# Patient Record
Sex: Female | Born: 1935 | Race: White | Hispanic: No | Marital: Married | Smoking: Never smoker
Health system: Southern US, Community
[De-identification: ages and names within clinical notes are randomized; demographics above are authoritative.]

## PROBLEM LIST (undated history)

## (undated) DIAGNOSIS — E119 Type 2 diabetes mellitus without complications: Secondary | ICD-10-CM

## (undated) DIAGNOSIS — B029 Zoster without complications: Secondary | ICD-10-CM

## (undated) DIAGNOSIS — N186 End stage renal disease: Secondary | ICD-10-CM

## (undated) HISTORY — PX: CHOLECYSTECTOMY: SHX55

## (undated) HISTORY — PX: KIDNEY TRANSPLANT: SHX239

## (undated) HISTORY — PX: ABDOMINAL HYSTERECTOMY: SHX81

## (undated) HISTORY — PX: DIALYSIS FISTULA CREATION: SHX611

## (undated) HISTORY — PX: NEPHRECTOMY TRANSPLANTED ORGAN: SUR880

## (undated) HISTORY — PX: OTHER SURGICAL HISTORY: SHX169

---

## 1998-10-17 ENCOUNTER — Encounter: Admission: RE | Admit: 1998-10-17 | Discharge: 1998-10-17 | Payer: Self-pay | Admitting: Nephrology

## 1999-06-24 ENCOUNTER — Encounter (HOSPITAL_COMMUNITY): Admission: RE | Admit: 1999-06-24 | Discharge: 1999-09-22 | Payer: Self-pay | Admitting: Nephrology

## 1999-11-05 ENCOUNTER — Encounter (HOSPITAL_COMMUNITY): Admission: RE | Admit: 1999-11-05 | Discharge: 2000-02-03 | Payer: Self-pay | Admitting: Nephrology

## 2000-02-27 ENCOUNTER — Encounter: Payer: Self-pay | Admitting: Vascular Surgery

## 2000-03-02 ENCOUNTER — Ambulatory Visit (HOSPITAL_COMMUNITY): Admission: RE | Admit: 2000-03-02 | Discharge: 2000-03-02 | Payer: Self-pay | Admitting: Vascular Surgery

## 2000-04-09 ENCOUNTER — Inpatient Hospital Stay (HOSPITAL_COMMUNITY): Admission: AD | Admit: 2000-04-09 | Discharge: 2000-04-11 | Payer: Self-pay | Admitting: Nephrology

## 2000-04-10 ENCOUNTER — Encounter: Payer: Self-pay | Admitting: Nephrology

## 2000-04-12 ENCOUNTER — Emergency Department (HOSPITAL_COMMUNITY): Admission: EM | Admit: 2000-04-12 | Discharge: 2000-04-12 | Payer: Self-pay | Admitting: Emergency Medicine

## 2000-11-30 ENCOUNTER — Ambulatory Visit (HOSPITAL_COMMUNITY): Admission: RE | Admit: 2000-11-30 | Discharge: 2000-11-30 | Payer: Self-pay | Admitting: Vascular Surgery

## 2001-01-15 ENCOUNTER — Other Ambulatory Visit: Admission: RE | Admit: 2001-01-15 | Discharge: 2001-01-15 | Payer: Self-pay | Admitting: Obstetrics & Gynecology

## 2001-01-28 ENCOUNTER — Ambulatory Visit (HOSPITAL_COMMUNITY): Admission: RE | Admit: 2001-01-28 | Discharge: 2001-01-28 | Payer: Self-pay | Admitting: *Deleted

## 2001-02-04 ENCOUNTER — Ambulatory Visit (HOSPITAL_COMMUNITY): Admission: RE | Admit: 2001-02-04 | Discharge: 2001-02-04 | Payer: Self-pay | Admitting: Vascular Surgery

## 2001-02-04 ENCOUNTER — Encounter: Payer: Self-pay | Admitting: Vascular Surgery

## 2001-03-19 ENCOUNTER — Encounter: Payer: Self-pay | Admitting: Vascular Surgery

## 2001-03-19 ENCOUNTER — Ambulatory Visit (HOSPITAL_COMMUNITY): Admission: RE | Admit: 2001-03-19 | Discharge: 2001-03-19 | Payer: Self-pay | Admitting: Vascular Surgery

## 2001-04-08 ENCOUNTER — Ambulatory Visit (HOSPITAL_COMMUNITY): Admission: RE | Admit: 2001-04-08 | Discharge: 2001-04-08 | Payer: Self-pay | Admitting: Vascular Surgery

## 2001-04-27 ENCOUNTER — Ambulatory Visit (HOSPITAL_COMMUNITY): Admission: RE | Admit: 2001-04-27 | Discharge: 2001-04-27 | Payer: Self-pay | Admitting: Vascular Surgery

## 2001-05-13 ENCOUNTER — Ambulatory Visit (HOSPITAL_BASED_OUTPATIENT_CLINIC_OR_DEPARTMENT_OTHER): Admission: RE | Admit: 2001-05-13 | Discharge: 2001-05-13 | Payer: Self-pay | Admitting: *Deleted

## 2001-09-30 ENCOUNTER — Encounter: Payer: Self-pay | Admitting: Nephrology

## 2001-09-30 ENCOUNTER — Ambulatory Visit (HOSPITAL_COMMUNITY): Admission: RE | Admit: 2001-09-30 | Discharge: 2001-09-30 | Payer: Self-pay | Admitting: Nephrology

## 2001-11-16 ENCOUNTER — Encounter: Payer: Self-pay | Admitting: Family Medicine

## 2001-11-16 ENCOUNTER — Ambulatory Visit (HOSPITAL_COMMUNITY): Admission: RE | Admit: 2001-11-16 | Discharge: 2001-11-16 | Payer: Self-pay | Admitting: Family Medicine

## 2002-03-21 ENCOUNTER — Ambulatory Visit (HOSPITAL_COMMUNITY): Admission: RE | Admit: 2002-03-21 | Discharge: 2002-03-21 | Payer: Self-pay | Admitting: Family Medicine

## 2002-03-21 ENCOUNTER — Encounter: Payer: Self-pay | Admitting: Family Medicine

## 2002-04-07 ENCOUNTER — Encounter (HOSPITAL_COMMUNITY): Admission: RE | Admit: 2002-04-07 | Discharge: 2002-05-07 | Payer: Self-pay | Admitting: Internal Medicine

## 2002-04-14 ENCOUNTER — Encounter: Payer: Self-pay | Admitting: Neurology

## 2002-10-24 ENCOUNTER — Ambulatory Visit (HOSPITAL_COMMUNITY): Admission: RE | Admit: 2002-10-24 | Discharge: 2002-10-24 | Payer: Self-pay | Admitting: General Surgery

## 2002-12-13 ENCOUNTER — Ambulatory Visit (HOSPITAL_COMMUNITY): Admission: RE | Admit: 2002-12-13 | Discharge: 2002-12-13 | Payer: Self-pay | Admitting: Family Medicine

## 2002-12-14 ENCOUNTER — Ambulatory Visit (HOSPITAL_COMMUNITY): Admission: RE | Admit: 2002-12-14 | Discharge: 2002-12-14 | Payer: Self-pay | Admitting: Family Medicine

## 2003-02-18 ENCOUNTER — Encounter: Admission: RE | Admit: 2003-02-18 | Discharge: 2003-02-18 | Payer: Self-pay | Admitting: Neurology

## 2003-02-23 ENCOUNTER — Encounter: Admission: RE | Admit: 2003-02-23 | Discharge: 2003-02-23 | Payer: Self-pay | Admitting: Neurology

## 2003-02-28 ENCOUNTER — Encounter: Admission: RE | Admit: 2003-02-28 | Discharge: 2003-02-28 | Payer: Self-pay | Admitting: Neurology

## 2003-04-07 ENCOUNTER — Encounter: Admission: RE | Admit: 2003-04-07 | Discharge: 2003-04-07 | Payer: Self-pay | Admitting: Neurology

## 2003-05-04 ENCOUNTER — Encounter: Admission: RE | Admit: 2003-05-04 | Discharge: 2003-05-04 | Payer: Self-pay | Admitting: Neurology

## 2004-03-25 ENCOUNTER — Ambulatory Visit (HOSPITAL_COMMUNITY): Admission: RE | Admit: 2004-03-25 | Discharge: 2004-03-25 | Payer: Self-pay | Admitting: Ophthalmology

## 2004-09-06 ENCOUNTER — Ambulatory Visit (HOSPITAL_COMMUNITY): Admission: RE | Admit: 2004-09-06 | Discharge: 2004-09-06 | Payer: Self-pay | Admitting: Family Medicine

## 2005-01-13 ENCOUNTER — Ambulatory Visit (HOSPITAL_COMMUNITY): Admission: RE | Admit: 2005-01-13 | Discharge: 2005-01-13 | Payer: Self-pay | Admitting: Family Medicine

## 2005-01-13 ENCOUNTER — Ambulatory Visit (HOSPITAL_COMMUNITY): Admission: RE | Admit: 2005-01-13 | Discharge: 2005-01-13 | Payer: Self-pay | Admitting: Ophthalmology

## 2005-07-23 ENCOUNTER — Ambulatory Visit (HOSPITAL_COMMUNITY): Admission: RE | Admit: 2005-07-23 | Discharge: 2005-07-23 | Payer: Self-pay | Admitting: Family Medicine

## 2005-08-03 ENCOUNTER — Emergency Department (HOSPITAL_COMMUNITY): Admission: EM | Admit: 2005-08-03 | Discharge: 2005-08-03 | Payer: Self-pay | Admitting: Emergency Medicine

## 2005-08-04 ENCOUNTER — Ambulatory Visit (HOSPITAL_COMMUNITY): Admission: RE | Admit: 2005-08-04 | Discharge: 2005-08-04 | Payer: Self-pay | Admitting: Emergency Medicine

## 2005-09-05 ENCOUNTER — Emergency Department (HOSPITAL_COMMUNITY): Admission: EM | Admit: 2005-09-05 | Discharge: 2005-09-05 | Payer: Self-pay | Admitting: Emergency Medicine

## 2005-09-07 ENCOUNTER — Emergency Department (HOSPITAL_COMMUNITY): Admission: EM | Admit: 2005-09-07 | Discharge: 2005-09-07 | Payer: Self-pay | Admitting: Emergency Medicine

## 2005-09-22 ENCOUNTER — Ambulatory Visit (HOSPITAL_COMMUNITY): Admission: RE | Admit: 2005-09-22 | Discharge: 2005-09-22 | Payer: Self-pay | Admitting: Family Medicine

## 2005-10-02 ENCOUNTER — Ambulatory Visit (HOSPITAL_COMMUNITY): Admission: RE | Admit: 2005-10-02 | Discharge: 2005-10-02 | Payer: Self-pay | Admitting: Family Medicine

## 2005-10-07 ENCOUNTER — Encounter (INDEPENDENT_AMBULATORY_CARE_PROVIDER_SITE_OTHER): Payer: Self-pay | Admitting: Specialist

## 2005-10-07 ENCOUNTER — Ambulatory Visit (HOSPITAL_COMMUNITY): Admission: RE | Admit: 2005-10-07 | Discharge: 2005-10-07 | Payer: Self-pay | Admitting: Gastroenterology

## 2005-10-07 ENCOUNTER — Ambulatory Visit: Payer: Self-pay | Admitting: Gastroenterology

## 2006-11-28 ENCOUNTER — Emergency Department (HOSPITAL_COMMUNITY): Admission: EM | Admit: 2006-11-28 | Discharge: 2006-11-28 | Payer: Self-pay | Admitting: Emergency Medicine

## 2007-02-22 ENCOUNTER — Ambulatory Visit (HOSPITAL_COMMUNITY): Admission: RE | Admit: 2007-02-22 | Discharge: 2007-02-22 | Payer: Self-pay | Admitting: Ophthalmology

## 2007-02-22 ENCOUNTER — Encounter (INDEPENDENT_AMBULATORY_CARE_PROVIDER_SITE_OTHER): Payer: Self-pay | Admitting: Ophthalmology

## 2007-05-06 ENCOUNTER — Ambulatory Visit (HOSPITAL_COMMUNITY): Admission: RE | Admit: 2007-05-06 | Discharge: 2007-05-06 | Payer: Self-pay | Admitting: Family Medicine

## 2007-06-07 ENCOUNTER — Ambulatory Visit (HOSPITAL_COMMUNITY): Admission: RE | Admit: 2007-06-07 | Discharge: 2007-06-07 | Payer: Self-pay | Admitting: Ophthalmology

## 2007-07-19 ENCOUNTER — Encounter: Payer: Self-pay | Admitting: Orthopedic Surgery

## 2007-07-19 ENCOUNTER — Ambulatory Visit (HOSPITAL_COMMUNITY): Admission: RE | Admit: 2007-07-19 | Discharge: 2007-07-19 | Payer: Self-pay | Admitting: Family Medicine

## 2007-08-18 ENCOUNTER — Ambulatory Visit: Payer: Self-pay | Admitting: Gastroenterology

## 2007-08-30 ENCOUNTER — Ambulatory Visit (HOSPITAL_COMMUNITY): Admission: RE | Admit: 2007-08-30 | Discharge: 2007-08-30 | Payer: Self-pay | Admitting: Gastroenterology

## 2007-08-30 ENCOUNTER — Ambulatory Visit: Payer: Self-pay | Admitting: Gastroenterology

## 2009-01-23 ENCOUNTER — Ambulatory Visit (HOSPITAL_COMMUNITY): Admission: RE | Admit: 2009-01-23 | Discharge: 2009-01-23 | Payer: Self-pay | Admitting: Nephrology

## 2009-01-31 ENCOUNTER — Ambulatory Visit (HOSPITAL_COMMUNITY): Admission: RE | Admit: 2009-01-31 | Discharge: 2009-01-31 | Payer: Self-pay | Admitting: Nephrology

## 2009-02-01 ENCOUNTER — Ambulatory Visit (HOSPITAL_COMMUNITY): Admission: RE | Admit: 2009-02-01 | Discharge: 2009-02-01 | Payer: Self-pay | Admitting: Family Medicine

## 2009-02-12 DIAGNOSIS — I10 Essential (primary) hypertension: Secondary | ICD-10-CM | POA: Insufficient documentation

## 2009-02-12 DIAGNOSIS — Z8679 Personal history of other diseases of the circulatory system: Secondary | ICD-10-CM | POA: Insufficient documentation

## 2009-02-12 DIAGNOSIS — N19 Unspecified kidney failure: Secondary | ICD-10-CM | POA: Insufficient documentation

## 2009-02-12 DIAGNOSIS — Z8719 Personal history of other diseases of the digestive system: Secondary | ICD-10-CM

## 2009-02-12 DIAGNOSIS — Z85828 Personal history of other malignant neoplasm of skin: Secondary | ICD-10-CM

## 2009-02-12 DIAGNOSIS — Z8611 Personal history of tuberculosis: Secondary | ICD-10-CM

## 2009-02-13 ENCOUNTER — Ambulatory Visit: Payer: Self-pay | Admitting: Gastroenterology

## 2009-02-13 DIAGNOSIS — D126 Benign neoplasm of colon, unspecified: Secondary | ICD-10-CM

## 2009-02-16 DIAGNOSIS — K3184 Gastroparesis: Secondary | ICD-10-CM

## 2009-02-16 DIAGNOSIS — K5900 Constipation, unspecified: Secondary | ICD-10-CM | POA: Insufficient documentation

## 2009-02-16 DIAGNOSIS — K219 Gastro-esophageal reflux disease without esophagitis: Secondary | ICD-10-CM | POA: Insufficient documentation

## 2009-03-12 ENCOUNTER — Ambulatory Visit: Payer: Self-pay | Admitting: Orthopedic Surgery

## 2009-03-12 DIAGNOSIS — M6530 Trigger finger, unspecified finger: Secondary | ICD-10-CM | POA: Insufficient documentation

## 2009-06-11 ENCOUNTER — Encounter: Payer: Self-pay | Admitting: Orthopedic Surgery

## 2009-06-15 ENCOUNTER — Encounter: Payer: Self-pay | Admitting: Orthopedic Surgery

## 2009-06-15 ENCOUNTER — Emergency Department (HOSPITAL_COMMUNITY): Admission: EM | Admit: 2009-06-15 | Discharge: 2009-06-15 | Payer: Self-pay | Admitting: Emergency Medicine

## 2009-06-18 ENCOUNTER — Ambulatory Visit: Payer: Self-pay | Admitting: Orthopedic Surgery

## 2009-06-18 DIAGNOSIS — S42309A Unspecified fracture of shaft of humerus, unspecified arm, initial encounter for closed fracture: Secondary | ICD-10-CM

## 2009-06-19 ENCOUNTER — Ambulatory Visit: Payer: Self-pay | Admitting: Orthopedic Surgery

## 2009-06-19 ENCOUNTER — Ambulatory Visit (HOSPITAL_COMMUNITY): Admission: RE | Admit: 2009-06-19 | Discharge: 2009-06-20 | Payer: Self-pay | Admitting: Orthopedic Surgery

## 2009-06-20 ENCOUNTER — Encounter: Payer: Self-pay | Admitting: Gastroenterology

## 2009-06-21 ENCOUNTER — Ambulatory Visit: Payer: Self-pay | Admitting: Orthopedic Surgery

## 2009-07-03 ENCOUNTER — Ambulatory Visit: Payer: Self-pay | Admitting: Orthopedic Surgery

## 2009-07-05 ENCOUNTER — Telehealth: Payer: Self-pay | Admitting: Orthopedic Surgery

## 2009-07-11 ENCOUNTER — Encounter: Payer: Self-pay | Admitting: Orthopedic Surgery

## 2009-07-11 ENCOUNTER — Encounter (HOSPITAL_COMMUNITY): Admission: RE | Admit: 2009-07-11 | Discharge: 2009-08-10 | Payer: Self-pay | Admitting: Orthopedic Surgery

## 2009-07-18 ENCOUNTER — Ambulatory Visit: Payer: Self-pay | Admitting: Orthopedic Surgery

## 2009-08-13 ENCOUNTER — Encounter (HOSPITAL_COMMUNITY)
Admission: RE | Admit: 2009-08-13 | Discharge: 2009-09-12 | Payer: Self-pay | Source: Home / Self Care | Admitting: Orthopedic Surgery

## 2009-08-20 ENCOUNTER — Ambulatory Visit: Payer: Self-pay | Admitting: Orthopedic Surgery

## 2009-08-22 ENCOUNTER — Encounter: Payer: Self-pay | Admitting: Orthopedic Surgery

## 2009-08-29 ENCOUNTER — Encounter: Payer: Self-pay | Admitting: Orthopedic Surgery

## 2009-09-14 ENCOUNTER — Encounter (HOSPITAL_COMMUNITY): Admission: RE | Admit: 2009-09-14 | Discharge: 2009-10-14 | Payer: Self-pay | Admitting: Orthopedic Surgery

## 2009-09-20 ENCOUNTER — Ambulatory Visit: Payer: Self-pay | Admitting: Orthopedic Surgery

## 2009-09-28 ENCOUNTER — Encounter: Payer: Self-pay | Admitting: Orthopedic Surgery

## 2009-10-10 ENCOUNTER — Ambulatory Visit (HOSPITAL_COMMUNITY): Admission: RE | Admit: 2009-10-10 | Discharge: 2009-10-10 | Payer: Self-pay | Admitting: Nephrology

## 2010-01-26 ENCOUNTER — Encounter: Payer: Self-pay | Admitting: Neurology

## 2010-01-27 ENCOUNTER — Encounter: Payer: Self-pay | Admitting: Nephrology

## 2010-02-07 NOTE — Miscellaneous (Signed)
Summary: OT Progress note  OT Progress note   Imported By: Jacklynn Ganong 09/28/2009 08:42:49  _____________________________________________________________________  External Attachment:    Type:   Image     Comment:   External Document

## 2010-02-07 NOTE — Miscellaneous (Signed)
Summary: OT clinical evaluationo  OT clinical evaluationo   Imported By: Jacklynn Ganong 07/26/2009 10:27:12  _____________________________________________________________________  External Attachment:    Type:   Image     Comment:   External Document

## 2010-02-07 NOTE — Assessment & Plan Note (Signed)
Summary: 1 m RE-CHECK/XRAYS LT SHOULDER/FX CARE/MEDICARE,BCBS/CAF   Referring Mesha Schamberger:  ap er Primary Trishelle Devora:  Nobie Putnam, M.D.   History of Present Illness: status post open treatment internal fixation with proximal locking humeral nail postop week #9  DOS 06/19/09   Meds: No pain med needed, doing well.  followup x-rays today of LEFT shoulder nail, one month follow up.  physical therapy at the hospital.  Things are going well she notices increased range of motion and function daily.  Still has some soreness but not requiring any medication  X-rays show fracture healing well good position of the implant and the fracture        Allergies: 1)  ! Codeine   Impression & Recommendations:  Problem # 1:  AFTERCARE FOLLOW SURGERY MUSCULOSKEL SYSTEM NEC (ICD-V58.78)  AP and lateral LEFT humerus proximal humeral nail shows adequate fixation no change in position of the implant fracture alignment acceptable fracture healing  Orders: Post-Op Check (16109) Humerus x-ray,  minimum 2 views (60454)  Problem # 2:  CLOSED FRACTURE OF SHAFT OF HUMERUS (ICD-812.21)  Orders: Post-Op Check (09811) Humerus x-ray,  minimum 2 views (91478)  Patient Instructions: 1)  xrays in 1 month [after 9/14]

## 2010-02-07 NOTE — Assessment & Plan Note (Signed)
Summary: 2 WK RE-CK/XRAYS LT SHOULDER/FX CARE/MEDICARE,BCBS/CAF   Referring Provider:  ap er Primary Provider:  Nobie Putnam, M.D.   History of Present Illness: 77 years old postop visit #3 status post open treatment internal fixation with locking humeral nail postop day #28  Meds: Phenergan,  and Percocet 5  physical therapy started in back today for x-rays and recheck  X-rays are obtained today multiple views of the LEFT upper extremity including the shoulder intramedullary nail with fracture impaction early callus formation is noted alignment is acceptable there is some translation in the fragments but this is acceptable.  Followup in one month x-ray repeat   Allergies: 1)  ! Codeine   Other Orders: Post-Op Check (16109) Shoulder x-ray,  minimum 2 views (60454)  Patient Instructions: 1)  xrays look good 2)  continue therapy 3)  come back in 1 month

## 2010-02-07 NOTE — Assessment & Plan Note (Signed)
Summary: left shoulder pain no recent xr/no injury/medicare/bcbs/crese...   Vital Signs:  Patient profile:   77 year old female Height:      67 inches Weight:      180 pounds BMI:     28.29 Pulse rate:   80 / minute CC: Right shoulder and hand pain Pain Assessment Patient in pain? no      Comments Kidney transplant x 3 years ago.  Dx Arthritis unable to take arthritic medicaiton due to kidney transplant.  Pain is intermittent and only occurs during certain movement.   Primary Provider:  Nobie Putnam, M.D.  CC:  Right shoulder and hand pain.  History of Present Illness: 77 year old female with a history of triggering phenomenon of her small fingers and index fingers.  Currently the LEFT small finger is bothering her with catching locking swelling tenderness pain.  She also complains of some shoulder pain in both shoulders LEFT greater than RIGHT but not hurting her today.  She says she's been doing some exercises for the shoulders and it has helped    Allergies: 1)  ! 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  3 children retired married  Review of Systems Constitutional:  Denies weight loss, weight gain, fever, chills, and fatigue. Cardiovascular:  mitral valve  prolapse. Respiratory:  Denies short of breath, wheezing, couch, tightness, pain on inspiration, and snoring . Gastrointestinal:  acid reflux. Genitourinary:  kidney transplant one year ago no hematuria. Neurologic:  Complains of unsteady gait and tremors. Musculoskeletal:  See HPI. Psychiatric:  Complains of anxiety. Skin:  Denies changes in the skin, poor healing, rash, itching, and redness. HEENT:  Complains of eye pain. Immunology:  Complains of seasonal allergies. Hemoatologic:  Complains of easy bleeding.  Physical Exam  Additional Exam:  vital signs as recorded  The patient is well developed and nourished, with normal grooming and hygiene. The body habitus is  Normal  The pulses and perfusion were normal with normal color, temperature  and no swelling  Lymph nodes were negative  Skin is normal  The coordination and sensation were normal  The reflexes were normal   Bilateral hands Tenderness over the A1 pulley of the RIGHT and LEFT small finger triggering of the LEFT small finger joints stable.  Full range of motion of the hands.   Impression & Recommendations:  Problem # 1:  TRIGGER FINGER (ICD-727.03) Assessment New  LEFT small finger trigger injection  Verbal consent was obtained: The finger was prepped with ethyl chloride and injected with 1:1 injection of .25% sensorcaine, 1cc  and 40 mg of depomedrol, 1cc. There were no complications.  Orders: New Patient Level III (16109) Joint Aspirate / Injection, Small (60454) Depo- Medrol 40mg  (J1030)  Patient Instructions: 1)  Please schedule a follow-up appointment as needed. 2)  You have received an injection of cortisone today. You may experience increased pain at  the injection site. Apply ice pack to the area for 20 minutes every 2 hours and take 2 xtra strength tylenol every 8 hours. This increased pain will usually resolve in 24 hours. The injection will take effect in 3-10 days.

## 2010-02-07 NOTE — Assessment & Plan Note (Signed)
Summary: [facility] FX LEFT HUMERUS XR THERE/MEDIARE/BCBS/BSF   Vital Signs:  Patient profile:   77 year old female Height:      68 inches Weight:      175 pounds Pulse rate:   70 / minute Resp:     16 per minute  Vitals Entered By: Fuller Canada MD (June 18, 2009 12:08 PM)  Visit Type:  new patient Referring Provider:  [facility] er Primary Provider:  Nobie Putnam, M.D.  CC:  left humerus fracture.  History of Present Illness: I saw [name] in the office today for an initial visit.  She is a 77 years old woman with the complaint of: PAIN LEFT ARM   DOI 06/15/09 fell.  Xrays [facility] 06/15/09 left shoulder.  Meds: Prograf, Rapamune, Prilosec, Norvasc, Lopressor, Multivitamin, Colace, Xanax, Clarinex, Lasix, Tylenol as needed, Calcium plus D.  SHE C/O SEVERE NON RADIATING LEFT SHOULDER PAIN WHICH IS SEVERE  NO NUMBNESS BUT CHRONIC PAIN IN THE LEFT GLENO-HUMERAL JOINT FROM ARTHRITS   Allergies: 1)  ! 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 Cardiovascular:  Denies chest pain, palpitations, fainting, and murmurs; MVP. Respiratory:  Complains of  short of breath; denies wheezing, couch, tightness, pain on inspiration, and snoring . Gastrointestinal:  Complains of heartburn, nausea, and diarrhea; denies vomiting, constipation, and blood in your stools. Genitourinary:  Complains of frequency and urgency; denies difficulty urinating, painful urination, flank pain, and bleeding in urine. Neurologic:  Complains of numbness, tingling, unsteady gait, and dizziness; denies tremors and seizure. Musculoskeletal:  Complains of joint pain, swelling, instability, stiffness, and muscle pain; denies redness and heat. Endocrine:  Denies excessive thirst, exessive urination, and heat or cold intolerance. Psychiatric:  Complains of anxiety; denies nervousness, depression, and hallucinations. Skin:  Complains of changes in the skin, poor healing, and itching; denies rash and redness. HEENT:  Complains of eye pain, redness, and watering; denies blurred or double vision. Immunology:  Denies seasonal allergies, sinus problems, and allergic to bee stings. Hemoatologic:  Complains of easy bleeding and brusing.  Physical Exam  Skin:  intact without lesions or rashes Cervical Nodes:  no significant adenopathy Axillary Nodes:  no significant adenopathy Psych:  alert and cooperative; normal mood and affect; normal attention span and concentration    Shoulder/Elbow Exam  General:    Well-developed, well-nourished, normal body habitus; no deformities, normal grooming.    Skin:    Intact, no  lesions, rashes, cafe au lait spots or bruising.    Inspection:    swelling: LEFT ARM   Palpation:    LEFT HUMERUS PROXIMAL TENDERNESS   Vascular:    LEFT ARM FISTULA  NORMAL RADIAL ULNA PULSES BILATERAL   Sensory:    Gross sensation intact in the upper extremities.  Motor:    decreased L-supraspinatus, decreased L-ER'S, and decreased L-IR'S.     The right upper  extremity has  normal appearance, ROM, strength and stability.   Reflexes:    Normal  reflexes in the right upper extremity   Shoulder Exam:    Right:    Inspection:  Normal    Palpation:  Normal    Stability:  stable    Left:    Inspection:  Abnormal    Palpation:  Abnormal    Stability:  stable   Impression & Recommendations:  Problem # 1:  CLOSED FRACTURE OF SHAFT OF HUMERUS (ICD-812.21) Assessment New  The x-rays were done at [facility]. The report and the films have been reviewed. proximal shaft fracture of the humerus left arm   REC OTIF WITH POLARIS HUMERAL NAIL [SHORT]  Orders: New Patient Level IV (14782)  Patient Instructions: 1)  Informed consent process: I have discussed the procedure with the patient. I have answered their questions. The risks of bleeding, infection, nerve and vascualr injury have been discussed. The diagnosis and reason for surgery have been explained. The patient demonstrates understanding of this discussion. Specific to this procedure risks include:  2)  injury to the fistula, bleeding 3)  hardware problems 4)  DOS 06/19/09 5)  I will call you today and let you know when to go for preop at [facility] short stay, take packet with you 6)  Post op 1 06/21/09

## 2010-02-07 NOTE — Assessment & Plan Note (Signed)
Summary: Tubullovillous Adenoma TAE 2007 [facility]    Visit Type:  Follow-up Visit Primary Care Provider:  Nobie Putnam, M.D.  Chief Complaint:  F/U  schedule TCS.  History of Present Illness: Had a physical and everything. Last TCS in 2007. Last Flex Sig 2009. Looks at stool-no hemorrhoid or bleeding. Changing diet habits and ventured off and diagnosed with diet controlled diabetes. 190 lbs in 2007. Weights been stable. Gained a little last year.  Worst thing he'd been through was eye problem-"terrible HAs".  Current Medications (verified): 1)  Prograf 1 Mg Caps (Tacrolimus) .... One and One-Half Tablet Twice Daily 2)  Rapamune 1 Mg Tabs (Sirolimus) .... Take 1 Tablet By Mouth Once A Day 3)  Norvasc 10 Mg Tabs (Amlodipine Besylate) .... Once Daily 4)  Lopressor 50 Mg Tabs (Metoprolol Tartrate) .... One and One-Half Tablet Twice Daily 5)  Multi-Vitamin  Tabs (Multiple Vitamin) .... Once Daily 6)  Colace 100 Mg Caps (Docusate Sodium) .... Two Times A Day 7)  Xanax 0.5 Mg Tabs (Alprazolam) .... Once Daily As Needed 8)  Clarinex 5 Mg Tabs (Desloratadine) .... Once Daily As Needed 9)  Lasix 40 Mg Tabs (Furosemide) .... Two Tablets Daily 10)  Tylenol 325 Mg Tabs (Acetaminophen) .... As Needed 11)  Omeprazole 20 Mg Cpdr (Omeprazole) .... Take 1 Tablet By Mouth Once A Day 12)  Rocaltrol 0.25 Mcg Caps (Calcitriol) .... One Capsule Mon., Wed, Fri 13)  Calcium 600 Mg Plus D .... Take 1 Tablet By Mouth Two Times A Day 14)  Amoxicillin 500 Mg .... Four Tablets One Hour Prior To Dental  Appt.  Allergies (verified): 1)  ! 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  3 children retired married  Vital Signs:  Patient profile:   77 year old female Height:      68 inches Weight:      187 pounds BMI:     28.54 Temp:     97.7 degrees F oral Pulse rate:   64 / minute BP sitting:   180 / 76  (right arm) Cuff size:   regular  Vitals Entered By: Cloria Spring LPN (February 13, 2009 1:44 PM)  Physical Exam  General:  Well developed, well nourished, no acute distress. Head:  Normocephalic and atraumatic. Lungs:  Clear throughout to auscultation. Heart:  Regular rate and rhythm Abdomen:  Soft, nontender and nondistended. Normal bowel sounds. Neurologic:  Alert and  oriented x4;  grossly normal neurologically.  Impression & Recommendations:  Problem # 1:  TUBULOVILLOUS ADENOMA, COLON (ICD-211.3)  TCS 2012-MIRALAX PREP, Full liquids  with supper 2 days prior to procedure then clear liquids. OPV as needed.  Orders: Est. Patient Level III (16109)  Problem # 2:  Hx of RENAL FAILURE (ICD-586) S/P Renal Transplant  CC: PCP     Appended Document: Tubullovillous Adenoma TAE 2007 [facility]  [facility] DEC 2010: HB 14.2 PLT 139 INR 1.06 CR 1.4 ALK PHOS 156 ALB  3.9 FERRITIN 328 TIBC 263 HEB B sAg NEG HIV NEG FK-506 10.1 HCV AB NEG

## 2010-02-07 NOTE — Assessment & Plan Note (Signed)
Summary: 1 M RE-CK/XRAYS LT SHOULDER/FX CARE/MEDICARE,BCBS/CAF   Visit Type:  Follow-up Referring Provider:  ap er Primary Provider:  Nobie Putnam, M.D.  CC:  recheck left shoulder post op.  History of Present Illness: status post open treatment internal fixation with proximal locking humeral nail postop week #9  DOS 06/19/09   Meds: No pain med needed, doing well.  followup x-rays today of LEFT shoulder nail, one month follow up.  Feels like she has done all she can do for PT with shoulder movement, does not feel she needs to go for any more therapy.  Some pain with shoulder after PT.  no pain she is doing very well.  has 90/90 abduction and forward elevation.  One of the screws is slightly prominent and it appears on x-ray that it has backed out a little bit but is not bothering her she has no pain at the fracture site  Recommend normal activities stop physical therapy return as needed  X-rays [facility] lateral LEFT shoulder there is a humeral nail when compared to previous films it appeared that the lower screw has backed out somewhat.  fracture callus is seen at the fracture site        Allergies: 1)  ! Codeine   Impression & Recommendations:  Problem # 1:  AFTERCARE FOLLOW SURGERY MUSCULOSKEL SYSTEM NEC (ICD-V58.78)  Orders: Post-Op Check (16109) Shoulder x-ray,  minimum 2 views (60454)  Problem # 2:  CLOSED FRACTURE OF SHAFT OF HUMERUS (ICD-812.21)  Orders: Post-Op Check (09811) Shoulder x-ray,  minimum 2 views (91478)  Patient Instructions: 1)  stop PT  2)  return as needed

## 2010-02-07 NOTE — Miscellaneous (Signed)
Summary: OT progress note  OT progress note   Imported By: Jacklynn Ganong 08/24/2009 10:31:17  _____________________________________________________________________  External Attachment:    Type:   Image     Comment:   External Document

## 2010-02-07 NOTE — Assessment & Plan Note (Signed)
Summary: POST OP 1/LT SHOULDER/SURG 06/19/09/MEDICARE,BCBS/CAF   Visit Type:  Follow-up Referring Provider:  ap er Primary Provider:  Nobie Putnam, M.D.  CC:  post op 1 shoulder.  History of Present Illness: I saw [name] in the office today for a followup visit.  She is a 77 years old woman with the complaint of:  post op 1 left shoulder, in sling today.  DOS 06/19/09 OTIF with polaris locking humeral nail, left shoulder.  POD 2, change dressing.  Meds: Phenergan,  Percocet 5 is what she is taking.  No codman exercises.  Doing well.  Percocet seems to be handling her pain her incisions look good       Allergies: 1)  ! Codeine   Impression & Recommendations:  Problem # 1:  CLOSED FRACTURE OF SHAFT OF HUMERUS (ZOX-096.04) Assessment Comment Only  Orders: Post-Op Check (54098)  Problem # 2:  AFTERCARE FOLLOW SURGERY MUSCULOSKEL SYSTEM NEC (ICD-V58.78) Assessment: Comment Only  stable  Orders: Post-Op Check (11914)  Patient Instructions: 1)  Come back 07/03/09 for suture removal and recheck post op left shoulder

## 2010-02-07 NOTE — Assessment & Plan Note (Signed)
Summary: POST OP SUT REM + RE-CK LT SHOULDER/MEDICARE,BCBS/CAF   Visit Type:  Follow-up Referring Provider:  ap er Primary Provider:  Nobie Putnam, M.D.  CC:  post op left shoulder.  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  POD 14 suture removal.  Meds: Phenergan,  and Percocet 5  No codman exercises had been done although they were initiated in the hospital  Doing well.  Today is a  12 day recheck and suture removal.  she is doing her own hand exercises and that is improving with improved motion decrease swelling  No neurologic deficits noted at this time  Recommend start physical therapy x-rays in 2 weeks   Allergies: 1)  ! Codeine   Impression & Recommendations:  Problem # 1:  AFTERCARE FOLLOW SURGERY MUSCULOSKEL SYSTEM NEC (ICD-V58.78) Assessment Improved  Orders: Physical Therapy Referral (PT) Post-Op Check (16109)  Problem # 2:  CLOSED FRACTURE OF SHAFT OF HUMERUS (UEA-540.98) Assessment: Improved  Orders: Physical Therapy Referral (PT) Post-Op Check (11914)  Patient Instructions: 1)  Start PT after the 4th of July  2)  PROM exercises left Shoulder  3)  xrays in 2 weeks left shoulder [ Humeral Nail ]

## 2010-02-07 NOTE — Miscellaneous (Signed)
Summary: OT progress note  OT progress note   Imported By: Jacklynn Ganong 09/13/2009 16:13:45  _____________________________________________________________________  External Attachment:    Type:   Image     Comment:   External Document

## 2010-02-07 NOTE — Progress Notes (Signed)
Summary: refill request   Phone Note Call from Patient   Caller: Patient Summary of Call: Patient called to request refill on pain medication.  States when she came for her appt 6/29 she was asked if she had enough and now states she needs refill.  Oxycodone W/ [facility] 5/325 Ph [id number] Initial call taken by: Cammie Sickle,  July 05, 2009 9:03 AM  Follow-up for Phone Call        refill  Follow-up by: Fuller Canada MD,  July 05, 2009 9:44 AM  Additional Follow-up for Phone Call Additional follow up Details #1::        wrote rx, called pt and advised it is ready for pick up Additional Follow-up by: Ether Griffins,  July 05, 2009 9:52 AM

## 2010-02-07 NOTE — Letter (Signed)
Summary: Historic Patient File  Historic Patient File   Imported By: Elvera Maria 03/13/2009 11:47:47  _____________________________________________________________________  External Attachment:    Type:   Image     Comment:   history form

## 2010-02-07 NOTE — Letter (Signed)
Summary: History form  History form   Imported By: Jacklynn Ganong 06/25/2009 13:06:27  _____________________________________________________________________  External Attachment:    Type:   Image     Comment:   External Document

## 2010-02-08 NOTE — Letter (Signed)
Summary: Historic Patient File  Historic Patient File   Imported By: Elvera Maria 03/13/2009 12:00:57  _____________________________________________________________________  External Attachment:    Type:   Image     Comment:   reg sheet

## 2010-02-08 NOTE — Letter (Signed)
Summary: External Other  External Other   Imported By: Peggyann Shoals 06/20/2009 13:51:33  _____________________________________________________________________  External Attachment:    Type:   Image     Comment:   External Document

## 2010-03-25 LAB — COMPREHENSIVE METABOLIC PANEL
ALT: 42 U/L — ABNORMAL HIGH (ref 0–35)
Alkaline Phosphatase: 70 U/L (ref 39–117)
CO2: 27 mEq/L (ref 19–32)
Chloride: 99 mEq/L (ref 96–112)
GFR calc Af Amer: 53 mL/min — ABNORMAL LOW (ref >60)
GFR calc non Af Amer: 44 mL/min — ABNORMAL LOW (ref >60)
Potassium: 3.9 mEq/L (ref 3.5–5.1)
Sodium: 140 mEq/L (ref 135–145)

## 2010-03-25 LAB — GLUCOSE, CAPILLARY
Glucose-Capillary: 143 mg/dL — ABNORMAL HIGH (ref 70–99)
Glucose-Capillary: 256 mg/dL — ABNORMAL HIGH (ref 70–99)

## 2010-03-25 LAB — CBC
Hemoglobin: 11.7 g/dL — ABNORMAL LOW (ref 12.0–15.0)
MCV: 83.5 fL (ref 78.0–100.0)
Platelets: 111 10*3/uL — ABNORMAL LOW (ref 150–400)
RDW: 14 % (ref 11.5–15.5)
WBC: 7 10*3/uL (ref 4.0–10.5)

## 2010-03-25 LAB — SURGICAL PCR SCREEN: MRSA, PCR: NEGATIVE

## 2010-05-21 NOTE — Op Note (Signed)
Susan, [name]              ACCOUNT NO.:  [id number]   MEDICAL RECORD NO.:  [id number]          PATIENT TYPE:  AMB   LOCATION:  DAY                           FACILITY:  [facility]   PHYSICIAN:  Kassie Mends, M.D.      DATE OF BIRTH:  May 04, 1930   DATE OF PROCEDURE:  08/30/2007  DATE OF DISCHARGE:                               OPERATIVE REPORT   REFERRING PHYSICIAN:  Patrica Duel, MD   PROCEDURE:  Sigmoidoscopy.   INDICATIONS FOR EXAM:  [name] is a 77 year old female who was  initially seen in October 2007.  Had a 2-3-cm rectal polyp just proximal  to the dentate line.  She subsequently had rectal ultrasound and  transanal excision by Dr. Byrd Hesselbach at [facility].  She presents for follow up sigmoidoscopy.   FINDINGS:  1. Scar seen just above the dentate line without evidence of polyps.      Otherwise, no masses, inflammatory changes, or AVMs seen.  No      internal hemorrhoids.  2. Frequent descending colon and sigmoid colon diverticula.   DIAGNOSIS:  Mild descending colon and sigmoid colon diverticulosis.   RECOMMENDATIONS:  1. She should follow high-fiber diet.  She is given a handout on high-      fiber diet and diverticulosis.  2. Sigmoidoscopy in 3 years because the initial polyp had focal high-      grade dysplasia.   MEDICATIONS:  None.   PROCEDURE TECHNIQUE:  Physical exam was performed.  Informed consent was  obtained from the patient after explaining the benefits, risks, and  alternatives to the procedure.  The patient was connected to the monitor  and placed in left lateral position.  Continuous oxygen was provided by  nasal cannula and IV medicine administered through an indwelling  cannula.  The patient declined sedation.  After rectal exam, the  patient's rectum was  intubated and scope was advanced under direct visualization to the  distal transverse colon.  The scope was removed slowly by carefully  examining the  color, texture, anatomy, and integrity of the mucosa on  the way out.  The patient was recovered in endoscopy and discharged home  in satisfactory condition.      Kassie Mends, M.D.  Electronically Signed     SM/MEDQ  D:  08/30/2007  T:  08/31/2007  Job:  [id number]   cc:   Patrica Duel, M.D.  Fax: [phone]

## 2010-05-21 NOTE — Consult Note (Signed)
[name], [name] Ashwika                 ACCOUNT NO.:  [id number]   MEDICAL RECORD NO.:  [id number]          PATIENT TYPE:  EMS   LOCATION:  ED                            FACILITY:  [facility]   PHYSICIAN:  J. Darreld Mclean, M.D. DATE OF BIRTH:  29-Nov-1930   DATE OF CONSULTATION:  DATE OF DISCHARGE:  11/28/2006                                 CONSULTATION   CHIEF COMPLAINT:  Larey Seat and hurt my arm.   REQUESTING PHYSICIAN:  Consultation requested by the ER doctor.   The patient fell this evening and hurt her right arm.  She fell on an  outstretched right arm.  She has a fracture of the distal radius with  dorsal displacements.  She also hit her left knee, but the knee is not  hurting.  There were no other injuries, no loss of consciousness.  She  has a deformity of the right wrist.  She has swelling and tenderness.   The patient is status post renal dialysis and a kidney transplant last  December.  She is doing well with her kidney.  She no longer goes to  dialysis.  General health is otherwise good.   Right wrist was prepped and draped with Betadine, and she had a hematoma  block with 1% plain Xylocaine.  I previously explained to the patient  and her husband what I was going to do.  Closed reduction was carried  out.  I am now awaiting the x-rays.   INSTRUCTIONS GIVEN FOR CARE:  Elevation, ice, sling.  I will see her in  the office Monday morning, prescription for Darvocet N-100 given.  Return to the emergency room this weekend, if any difficulty.           ______________________________  Shela Commons. Darreld Mclean, M.D.     JWK/MEDQ  D:  11/28/2006  T:  11/29/2006  Job:  [id number]

## 2010-05-21 NOTE — H&P (Signed)
[name], [name]              ACCOUNT NO.:  [id number]   MEDICAL RECORD NO.:  [id number]          PATIENT TYPE:  AMB   LOCATION:  DAY                           FACILITY:  [facility]   PHYSICIAN:  Kassie Mends, M.D.      DATE OF BIRTH:  29-Feb-1930   DATE OF ADMISSION:  DATE OF DISCHARGE:  LH                              HISTORY & PHYSICAL   CHIEF COMPLAINT:  Needs flexible sigmoidoscopy, history of polyps.   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 Durenda Hurt in November 2007.  She is due  for surveillance exam.  Her last complete colonoscopy was by Dr. Kassie Mends, October 07, 2005.  Colonoscopy was done given history of polyps  and also undergoing evaluation for renal transplant.  She had a 2-3 cm  rectal polyp just proximal to the dentate line.  She had left-sided  diverticulosis.  Pathology came back tubulovillous adenoma with focal  high grade glandular dysplasia, but no invasive carcinoma.  She then  underwent a rectal ultrasound by Dr. Lanell Matar on October 23, 2005, and  endoscopically this looked to be a mucosal based tubulovillous lesion.  Biopsies again were taken and eventually she underwent a transanal  resection of this polyp by Dr. Durenda Hurt on October 25, 2005.  The  pathology revealed tubulovillous adenoma.  All margins were negative.  The patient has been doing fairly well.  She does have occasional  constipation and occasional postprandial loose stools which she has had  for some time.  She states she has been told she has IVS.  She complains  of hemorrhoids.  She states at times they are irritating.  She denies  any blood in the stool.  Denies any abdominal pain, nausea, or vomiting.  Her appetite is good.  Since then we last saw her she has had a renal  transplant as well as problems with her left eye requiring her eye to be  removed.   She states this all began with an infection.  She has recently  had a right cataract extraction on the right eye and is still  recovering.   CURRENT MEDICATIONS:  1. Prograf 1 mg tablet take 2 b.i.d.  2. Rapamune 1 mg 2 tablets daily.  3. Prilosec 40 mg daily.  4. Norvasc 10 mg daily.  5. Lopressor 50 mg b.i.d.  6. Multivitamin 1 daily.  7. Colace 100 mg b.i.d. p.r.n.  8. Xanax 0.5 mg p.r.n.  9. Clarinex 5 mg daily.  10.Lasix 20 mg daily.  11.She takes amoxicillin prior to dental appointment.  12.Tylenol p.r.n.   ALLERGIES:  She does not take CODEINE.   PAST MEDICAL HISTORY:  1. History of skin cancer.  2. Hypertension.  3. History of renal failure status post left nephrectomy in the remote      past.  She underwent renal transplant on December 10, 2005.  4. She has a history of mitral valve prolapse.  5. History of tuberculosis as a child.  6. Hypertension.  7. IVS.  8. History of tubulovillous adenoma of the rectum as outlined above.  9. History of 2 small AV malformations of the cecum ablated at time of      colonoscopy back in 2001.  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  11.Right cataract extraction recently.  12.History of cholecystectomy.  13.History of diverticulitis.  14.Tonsillectomy.   FAMILY HISTORY:  Mother deceased age 64, had dementia.  Father dying at  age 3 with metastatic colon cancer.  He had previous gastric cancer.  There is a questionable history of gastric cancer as well.   SOCIAL HISTORY:  She is married.  She has 3 children.  She is retired,  nonsmoker.  No alcohol use.   REVIEW OF SYSTEMS:  GI:  See HPI for GI.  CONSTITUTIONAL:  No unintentional weight loss.  CARDIOPULMONARY:  No chest pain or shortness of breath.  GENITOURINARY:  Denies any dysuria, or hematuria.   PHYSICAL EXAMINATION:  VITAL SIGNS:  Weight 176, height 5 feet 8,  temperature 97.8, blood pressure 142/70, pulse 80.  GENERAL:  Pleasant,  well-nourished, well-developed Caucasian female in  no acute distress.SKIN:  Warm and dry.  No jaundice.HEENT: Sclerae  nonicteric.  Oropharyngeal mucosa is moist and pink.  No lesions,  erythema or exudate.  No lymphadenopathy.  CHEST:  Lungs are clear  auscultation.CARDIAC:  Regular rate and rhythm.  No murmurs, rubs or  gallops.ABDOMEN:  Positive bowel sounds.  Abdomen soft, non tender,  nondistended.  No organomegaly or masses.  No abdominal bruits or  hernias.LOWER EXTREMITIES: Trace pedal edema bilaterally.   IMPRESSION:  [name] is a 77-year lady with a history of  tubulovillous adenomatous polyp of the rectum status post resection and  is here for surveillance flexible sigmoidoscopy.   PLAN:  1. Flexible sigmoidoscopy with Dr. Kassie Mends in the near future.  I      have discussed the case with Dr. Cira Servant.  We will advice a Fleet      enema 1 per rectum the morning of the prep.  2. Anusol-HC suppositories 25 mg per rectum q.h.s. for 14 days, 0      refills.  3. The patient has a history of mitral valve prolapse and previously      received prophylactic antibiotics, but based on new recommendations      she no longer needs to have SBE prophylaxis.      Tana Coast, P.A.      Kassie Mends, M.D.  Electronically Signed    LL/MEDQ  D:  08/18/2007  T:  08/19/2007  Job:  [id number]   cc:   Patrica Duel, M.D.  Fax: [phone]

## 2010-05-24 NOTE — Op Note (Signed)
[place]. [facility]  Patient:    [name], [name] Visit Number: [id number] MRN: [id number]          Service Type: DSU Location: [facility] 2866 01 Attending Physician:  Bennye Alm Dictated by:   Quita Skye Hart Rochester, M.D. Proc. Date: 03/19/01 Admit Date:  03/19/2001 Discharge Date: 03/19/2001                             Operative Report  PREOPERATIVE DIAGNOSIS:  Thrombosed AV Gore-Tex graft, right forearm.  POSTOPERATIVE DIAGNOSIS:  Thrombosed AV Gore-Tex graft, right forearm.  PROCEDURE:  Simple thrombectomy AV Gore-Tex graft, right forearm with expiration of venous end and intraoperative shuntogram x 2.  SURGEON:  Quita Skye. Hart [place], M.D.  FIRST ASSISTANT:  Gina H. Collins, P.A.-C.  ANESTHESIA:  Local.  PROCEDURE:  The patient was taken to the operating room and placed in the supine position at which time the right upper extremity was prepped.  Betadine scrub and solution and draped in a routine sterile manner.  After infiltration of 1% Xylocaine with epinephrine longitudinal incision was made over the Gore-Tex and cephalic vein anastomosis.  This was dissected free.   Then 3000 units of heparin was given intravenously.  Transverse opening was made in the graft just proximal to the anastomosis.  There was fresh thrombus filling the graft.  A 5 Fogarty catheter was then passed up the cephalic vein and would easily go to the deltopectoral groove and upon return fresh thrombus obtained followed by adequate back bleeding. The anastomosis itself was widely patent and the vein was at least 4.5 to 5 mm in size.  Fogarty was then passed around the graft itself which was easily thrombectomized and did not appear to have any areas of narrowing.  Excellent inflow was reestablished.  Multiple additional passes with the Fogarty yielded no debris.  Graft was closed with 2, 6-0 Prolene, clamps released, and there was an excellent pulse and thrill in the  graft.  No protamine was given.  Intraoperative arteriogram was performed x 2 to look at the graft itself and to look at the venous outflow both of which appeared normal with no evidence of stenosis.  Wound was closed with Vicryl in a subcuticular fashion.  Sterile dressing applied.  Patient taken to recovery room in satisfactory condition. Dictated by:   Quita Skye Hart [place], M.D. Attending Physician:  Bennye Alm DD:  03/19/01 TD:  03/20/01 Job: [id number]

## 2010-05-24 NOTE — Op Note (Signed)
[place]. [facility]  Patient:    [name], [name] Visit Number: [id number] MRN: [id number]          Service Type: DSU Location: [facility] 2855 01 Attending Physician:  Bennye Alm Dictated by:   Di Kindle Edilia Bo, M.D. Proc. Date: 02/04/01 Admit Date:  02/04/2001 Discharge Date: 02/04/2001                             Operative Report  PREOPERATIVE DIAGNOSIS:  Chronic renal failure with clotted right forearm arteriovenous graft.  POSTOPERATIVE DIAGNOSIS:  Chronic renal failure with clotted right forearm arteriovenous graft.  PROCEDURES: 1. Thrombectomy of right forearm arteriovenous graft. 2. Insertion of new segment of graft from more proximal cephalic vein. 3. Intraoperative fistulogram.  SURGEON:  Di Kindle. Edilia Bo, M.D.  ASSISTANT:  Lissa Merlin, P.A.  ANESTHESIA:  Local with sedation.  DESCRIPTION OF PROCEDURE:  The patient was taken to the operating room and sedated by anesthesia.  The entire left upper extremity was prepped and draped in the usual sterile fashion.  The incision above the antecubital level where I assume the venous anastomosis was was anesthetized, and this incision was opened.  Here the graft was not identified and apparently was anastomosed to the cephalic vein.  I explored both the brachial artery and brachial vein here, and both were quite small and I did not think the vein here was usable for an outflow source.  I also thought the artery was quite small for an upper arm graft.  I therefore decided to try to salvage the current graft into the cephalic vein.  After the skin was anesthetized, the incision at the antecubital level was opened and the arterial and venous limbs of the graft dissected free.  The vein was actually decent-sized.  A transverse graftotomy was made, and the patient was heparinized.  Graft thrombectomy was achieved using a #4 Fogarty catheter.  I was able to retrieve the arterial  plug, and there were no problems pulling the catheter through the body of the graft. Venous thrombectomy was performed, and the vein irrigated fairly easily with heparinized saline.  I passed the Fogarty catheter the entire length without evidence of obstruction.  A new segment of graft was then brought to the field, and the old venous anastomosis was excised.  A 7 mm PTFE graft was slightly spatulated and sewn end-to-end to the more proximal cephalic vein using continuous CV6 PTFE suture.  The graft was then cut to the appropriate length and anastomosed end-to-end to the old graft using continuous CV6 PTFE suture.  At the completion, thrill was adequate.  I shot an intraoperative fistulogram, which showed no technical problems, there were no problems in the graft itself, and no problems with the venous outflow as high up as I could see.  I was not totally satisfied with the thrill; therefore, I decided to explore the arterial end.  I made a transverse graftotomy, and digital pressure was used to control bleeding.  A large amount of retained pseudointima was retrieved, and the catheter was passed multiple times until no further clot was retrieved.  This was then closed with a running CV6 PTFE suture .  At the completion there was a good thrill in the graft.  Hemostasis was obtained in the wounds, and the wounds were closed with a deep layer of 3-0 Vicryl, the skin closed with 4-0 Vicryl.  A sterile dressing was applied. The patient tolerated  the procedure well, was transferred to the recovery room in satisfactory condition.  All needle and sponge counts were correct. Dictated by:   Di Kindle Edilia Bo, M.D. Attending Physician:  Bennye Alm DD:  02/04/01 TD:  02/04/01 Job: [phone]

## 2010-05-24 NOTE — H&P (Signed)
[place]. [facility]  Patient:    [name], [name]                       MRN: [id number] Adm. Date:  [id number] Attending:  Toma Copier Dictator:   Lyndee Leo. Foreman                         History and Physical  CHIEF COMPLAINT:  Left leg swelling.  HISTORY OF PRESENT ILLNESS:  A 77 year old white female with sudden onset of left leg swelling yesterday.  The patient denies any trauma to the leg. Otherwise she has no complaints, and the patient understands that she has been diagnosed with DVT and treatment to be initiated.  PAST MEDICAL HISTORY: 1. Left nephrectomy in January 1995, secondary to occluded renal artery. 2. Hypertension. 3. DJD. 4. Chronic renal insufficiency secondary to #1 and #2. 5. Hospitalized 1945 and treated for tuberculosis. 6. Mitral regurgitation. 7. Anemia and on Epogen. 8. Gout.  PAST SURGICAL HISTORY: 1. In 1982, cholecystectomy. 2. In 1979, hysterectomy. 3. In 1954, tonsillectomy.  ALLERGIES:  CODEINE.  MEDICATIONS:  1. Epogen 10,000 units q week.  2. Xanax 0.5 mg p.o. p.r.n.  3. Iron sulfate 240 mg p.o. b.i.d.  4. Multivitamin 1 p.o. q.d.  5. Vitamin E 400 IU p.o. q.d.  6. Claritin p.r.n.  7. Darvocet-N 100 p.r.n.  8. Tiazac 240 mg p.o. q.d.  9. Allopurinol 300 mg p.o. q.d. 10. Lasix 120 mg p.o. q.d. 11. Hectorol 2.5 mcg p.o. q.d. 12. Shohl solution 1 tbs p.o. q.d. 13. Protonix 40 mg p.o. q.d.  REVIEW OF SYSTEMS:  No syncope, no dyspnea, no chest pain, no palpitations, no nausea, vomiting, diarrhea, good p.o., decreased urine output, normal stools, no muscle weakness, no visual changes.  SOCIAL HISTORY:  The patient lives with her husband in [place].  She enjoys yard work.  She has three children.  No tobacco.  No alcohol.  Enjoys cross-stiching and traveling.  FAMILY HISTORY:  Noncontributory.  PHYSICAL EXAMINATION:  VITAL SIGNS:  Pending.  GENERAL:  Very pleasant, no acute distress, no  dyspnea, alert and oriented x 4.  HEENT:  Normocephalic, atraumatic.  Pupils, equal, round, reactive to light. Extraocular movements are intact.  There is no JVD, no nasal discharge.  CARDIOVASCULAR:  2/6 systolic ejection murmur heard best at the left lower sternal border.  RESPIRATORY:  Clear to auscultation bilaterally.  Good inspiratory and expiratory air movement.  There are no crackles, no wheezes.  ABDOMEN:  Soft, nontender.  Obese and positive bowel sounds.  No hepatosplenomegaly.  EXTREMITIES:  She has a left lower extremity which is warm.  She has a positive Homans sign.  There is a 1+ pedal pulse on the left and right bilaterally.  There is no ______ streaking noted on lower extremities.  LABORATORY DATA:  The patients baseline creatinine is 6.3-6.8.  Laboratory work on April 02, 2000, sodium 142, potassium 4.3, chloride 105, CO2 19, BUN 81, creatinine 6.8, glucose 134, albumin 4.0, calcium 8.6, phosphorus 5.0. Also had a 24-hour urine on April 02, 2000, volume 1200, urine creatinine 61.2, creatinine clearance 7, protein 34, and protein to creatinine ration was 0.56.  She has a CBC renal profile, PT and PTT pending today.  Doppler from today revealed a left lower extremity DVT of the superficial femoral vein, popliteal vein, and the left saphenous vein.  ASSESSMENT:  A 77 year old female with chronic renal insufficiency,  presenting with left lower extremity DVT.  PLAN: 1. Deep venous thrombosis.  Place her on heparin.  Plan on starting Coumadin    after catheter is placed by the CVTS tomorrow for possible dialysis.  The    patient will need to be therapeutic on Coumadin before discharge. 2. Chronic renal insufficiency.  The patient has had slowly increasing    creatinine.  She does have a fistula in the left arm but is not matured    yet.  Place a catheter by CVTS in her tomorrow should she need dialysis in    the near future before her fistula is matured. 3.  Intravenous heparin to go in the dorsum of her left hand and drawing PTs    and PTTs through her right extremity.  We will plan on continuing her home    Lasix, Shohl solution, multivitamin, iron, and Epogen. 4. Hypertension.  The patients hypertension was elevated earlier today, but    she has not taken her medications.  Will continue the Tiazac. 5. Gout.  We will continue the allopurinol. DD:  04/09/00 TD:  04/09/00 Job: [id number]

## 2010-05-24 NOTE — Op Note (Signed)
   NAME:  [name], Susan                        ACCOUNT NO.:  [id number]   MEDICAL RECORD NO.:  [id number]                   PATIENT TYPE:  AMB   LOCATION:  DAY                                  FACILITY:  [facility]   PHYSICIAN:  Dalia Heading, M.D.               DATE OF BIRTH:  [birth year]   DATE OF PROCEDURE:  DATE OF DISCHARGE:                                 OPERATIVE REPORT   PREOPERATIVE DIAGNOSS:  Cyst, right ear.   POSTOPERATIVE DIAGNOSS:  Cyst, right ear.   PROCEDURE:  Excision of 1.5 cm cyst, right ear.   SURGEON:  Dalia Heading, M.D.   ANESTHESIA:  MAC   INDICATIONS:  The patient is a 77 year old white female with multiple  medical problems who presents with an enlarging cyst in the posterior  auricular area of the right ear.  The patient, now comes, for excision of  the cyst.  The risks and benefits of the procedure were fully explained to  the patient, who gave informed consent.   DESCRIPTION OF PROCEDURE:  The patient was placed in the supine position.  The right posterior auricular region was prepped using the usual sterile  technique with Betadine.  Surgical site confirmation was performed.  Xylocaine 1% with epinephrine was used for local anesthesia.   An elliptical incision was made around the cyst.  This was taken down to the  subcutaneous tissue; and the cyst, which was noted to be necrotic, was  excised without difficulty.  Any bleeding was controlled with Bovie  electrocautery.  The skin was reapproximated using a 5-0 myon interrupted  suture.  Neosporin ointment was then applied.   All tape and needle counts were correct at the end of the procedure.  The  patient transferred back to Day Surgery in stable condition.   COMPLICATIONS:  None.    SPECIMEN:  Cyst, right posterior auricular.   BLOOD LOSS:  Minimal.      ___________________________________________                                            Dalia Heading, M.D.   MAJ/MEDQ  D:   10/24/2002  T:  10/24/2002  Job:  [id number]   cc:   Jorja Loa, M.D.  [address]  [place]  [place] [address]  Fax: [phone]   Patrica Duel, M.D.  [address]  [place]  [place] [address]  Fax: [phone]

## 2010-05-24 NOTE — Discharge Summary (Signed)
[place]. [facility]  Patient:    [name], [name]                       MRN: [id number] Adm. Date:  [id number] Disc. Date: [id number] Attending:  Osvaldo Human Dictator:   Nolon Nations, M.D.                           Discharge Summary  DATE OF BIRTH:  [birth year].  ADMISSION HISTORY:  Ms. Chiasson is a 77 year old female with a left-sided swelling.  She was found to have a deep venous thrombosis.  Please refer to the admit note for more complete history and physical.  HOSPITAL COURSE:  #1 - DVT:  The patient was placed on heparin.  She was started on Coumadin following cath placement for possible dialysis.  The patient was instructed on Lovenox outpatient administration.  She was discharged home on Lovenox and Coumadin and will follow up to monitor INR levels.  #2 - CRI:  The patient had slow increasing creatinine. She had a fissure in left arm that had not yet matured.  A catheter was placed by CVTS into the right IJ while in house.  #3 - HYPERTENSION:  The patient was found to have elevated blood pressure but had not been on medications.  Tiazac was restarted.  #4 - GOUT:  She was continued on her home dose of Allopurinol.  DISCHARGE MEDICATIONS: 1. Nephro-Vite one tablet q.i.d. 2. Ferrous sulfate 325 mg one tablet t.i.d. 3. Calcium two tablets t.i.d. with meals. 4. Vitamin E 400 i.u. p.o. q.d. 5. Tiazac 240 mg p.o. q.d. 6. Allopurinol 100 mg p.o. q.d. 7. Zemplar 5 mcg q. hemodialysis treatment. 8. Darvocet-N 100 p.o. q.6h. p.r.n. 9. Lovenox 100 mg sub. q. q.d.  FOLLOW-UP:  The patient is to follow up for INR and will have office follow-up Monday, April 13, 2000, at 10 a.m.  DD:  05/01/00 TD:  05/04/00 Job: [id number]

## 2010-05-24 NOTE — Op Note (Signed)
[name], [name]              ACCOUNT NO.:  [id number]   MEDICAL RECORD NO.:  [id number]          PATIENT TYPE:  AMB   LOCATION:  DAY                           FACILITY:  [facility]   PHYSICIAN:  [name], M.D.      DATE OF BIRTH:  02/06/1930   DATE OF PROCEDURE:  10/07/2005  DATE OF DISCHARGE:  10/07/2005                                 OPERATIVE REPORT   REFERRING PHYSICIAN:  Dr. Nobie [name].   PROCEDURE:  Colonoscopy with partial snare polypectomy.   INDICATION FOR EXAM:  [name] is a 77 year old female with a personal  history of polyps.  She presents for colonoscopy because she is undergoing  evaluation for renal transplant.   FINDINGS:  1. A 2- to 3-cm rectal polyp just proximal to the dentate line.  Biopsy      obtained via snare cautery.  2. Left-sided diverticulosis.  3. Otherwise no masses, inflammatory changes or vascular ectasia seen.  No      internal hemorrhoids.   RECOMMENDATIONS:  1. No heparin with dialysis on Wednesday or Friday of this week.  2. Rectal ultrasound at [facility] with Dr. Harlen [name] and possible endoscopic      mucosal resection in 3 weeks.  3. Follow up with Dr. Nobie [name] for intermittent tachycardia. Discussed      plan with [name] and with [name].  4. She is given information today in regards to high fiber diet,      diverticulosis and polyps.  5. Follow up colonoscopy will be determined after pathology report on the      polyp is finalized.   MEDICATIONS:  1. Fentanyl 100 mcg IV.  2. Versed 7 mg IV.  3. Phenergan 12.5 mg IV.   PROCEDURE TECHNIQUE:  Physical exam was performed and informed consent was  obtained from the patient after explaining the benefits, risks and  alternatives to the procedure.  The patient presented initially with a pulse  of 158.  EKG was performed and it revealed a sinus tachycardia with a heart  rate of 138.  Prior to initiation of the procedure, her heart rate was down  in the 90s.  She was  connected to the monitor and placed in the left lateral  position.  Continuous oxygen was provided by nasal cannula and IV medicine  administered through an indwelling cannula.  After administration of  sedation and rectal exam, the patient's rectum was intubated and the scope  was  passed under direct visualization to the cecum.  The scope was subsequently  removed slowly by carefully examine the color, texture, anatomy and  integrity of the mucosa on the way out.  The patient was recovered in the  endoscopy suite and discharged home in satisfactory condition.      [name], M.D.  Electronically Signed     SM/MEDQ  D:  10/09/2005  T:  10/10/2005  Job:  [id number]   cc:   [name], M.D.  Fax: [phone]

## 2010-05-24 NOTE — Procedures (Signed)
[name], [name]              ACCOUNT NO.:  [id number]   MEDICAL RECORD NO.:  [id number]          PATIENT TYPE:  EMS   LOCATION:  ED                            FACILITY:  [facility]   PHYSICIAN:  Edward L. Juanetta Gosling, M.D.DATE OF BIRTH:  [birth year]   DATE OF PROCEDURE:  DATE OF DISCHARGE:  09/05/2005                                EKG INTERPRETATION   8:56, September 05, 2005   The rhythm is a supraventricular tachycardia with a rate of about 150.  Slow  R-wave progression across precordium may indicate a previous anterior  infarction.  QT interval is prolonged.  Abnormal electrocardiogram.      Oneal Deputy. Juanetta Gosling, M.D.  Electronically Signed     ELH/MEDQ  D:  09/07/2005  T:  09/08/2005  Job:  [id number]

## 2010-05-24 NOTE — Op Note (Signed)
[place]. [facility]  Patient:    [name], [name] Visit Number: [id number] MRN: [id number]          Service Type: DSU Location: [facility] 2899 27 Attending Physician:  Bennye Alm Dictated by:   Di Kindle Edilia Bo, M.D. Proc. Date: 11/30/00 Admit Date:  11/30/2000 Discharge Date: 11/30/2000                             Operative Report  PREOPERATIVE DIAGNOSIS:  Chronic renal failure.  POSTOPERATIVE DIAGNOSIS:  Chronic renal failure.  OPERATION PERFORMED: Placement of new right forearm arteriovenous graft.  SURGEON:  Di Kindle. Edilia Bo, M.D.  ASSISTANT:  Maxwell Marion, RNFA  ANESTHESIA:  Local with sedation.  DESCRIPTION OF PROCEDURE:  The patient was taken to the operating room and sedated by Anesthesia.  The entire right upper extremity was prepped and draped in the usual sterile fashion.  After the skin was infiltrated with 1% lidocaine, the cephalic and basilic veins were dissected free.  Of note, she had a nice cephalic vein which was quite large.  These veins were preserved. Beneath the fascia, the artery was dissected free.  the artery was somewhat small.  Upon further interrogation, it was clear that this was the ulnar branch.  The radial branch was also identified and it was even smaller.  I decided to explore above the antecubital face to see if the artery here was bigger; however, again, the artery was not any larger and the patient obviously had a very high brachial artery bifurcation probably up in the axilla.  Rather than commit her to an upper arm loop graft, I thought the artery was probably adequate size for a forearm loop graft.  A 4 to 7 mm graft was then tunneled in a loop fashion in the right forearm with the arterial aspect of the graft along the ulnar aspect of the forearm and the venous aspect of the graft along the radial aspect of the forearm.  The patient was heparinized and then the ulnar artery was  clamped proximally and distally.  A longitudinal arteriotomy was made.  A segment of the 4 mm end of the graft was excised.  The graft was spatulated and sewn end-to-side to the ulnar artery using continuous CV6 PTFE suture.  The graft was then pulled to the appropriate length for anastomosis to the cephalic vein.  This was ligated distally, irrigated up nicely with heparinized saline.  The graft was cut to the appropriate length, spatulated.  It was sewn end-to-end to the cephalic vein using continuous CV6 PTFE suture.  At completion there was good thrill in the graft.  There was a radial and ulnar signal with a Doppler.  Hemostasis was obtained in the wounds.  The wounds were closed with a deep layer of 3-0 Vicryl and the skin closed with 4-0 Vicryl.  A sterile dressing was applied. The patient tolerated the procedure well and was transferred to the recovery room in satisfactory condition.  All needle and sponge counts were correct. dressing was applied.  The patient tolerated the procedure well and was transferred to the recovery room in satisfactory condition.  All needle and sponge counts were correct. Dictated by:   Di Kindle Edilia Bo, M.D. Attending Physician:  Bennye Alm DD:  11/30/00 TD:  11/30/00 Job: [id number]

## 2010-05-24 NOTE — Op Note (Signed)
[place]. [facility]  Patient:    [name], [name] Visit Number: [id number] MRN: [id number]          Service Type: DSU Location: [facility] 2860 01 Attending Physician:  Bennye Alm Dictated by:   Di Kindle Edilia Bo, M.D. Proc. Date: 04/08/01 Admit Date:  04/08/2001 Discharge Date: 04/08/2001                             Operative Report  PREOPERATIVE DIAGNOSIS:  Clotted right forearm arteriovenous graft.  POSTOPERATIVE DIAGNOSIS:  Clotted right forearm arteriovenous graft.  PROCEDURES: 1. Thrombectomy of right forearm arteriovenous graft. 2. Insertion of new segment of graft, more proximal cephalic vein.  SURGEON:  Di Kindle. Edilia Bo, M.D.  ASSISTANTTollie Pizza. Collins, P.A.-C.  ANESTHESIA:  Local with sedation.  DESCRIPTION OF PROCEDURE:  The patient was taken to the operating room, sedated by anesthesia.  The right upper extremity was prepped and draped in the usual sterile fashion.  After the skin was infiltrated with 1% lidocaine, the incision over the venous anastomosis was opened and the venous limb of the graft dissected free.  The graft was divided and graft thrombectomy achieved using a #4 Fogarty catheter after the patient was heparinized.  The arterial plug was retrieved and excellent inflow established.  There were no problems in pulling the catheter through the body of the graft.  Venous thrombectomy was performed, and the vein was widely patent.  At the completion, the vein and graft were flushed with heparinized saline.  In order to try something different, I did decide to jump higher on the cephalic vein with a new segment of interposition 7 mm graft.  This patient had a recent thrombectomy three weeks ago, and intraoperative fistulograms at that time showed no technical problems.  The other potential issue would be that her blood pressure has been low.  The other option will be to obtain a central venogram to look  for a more proximal stenosis that would not be seen on intraoperative venogram.  A new segment of 7 mm graft was brought to the field and after the old venous anastomosis was excised, the more proximal cephalic vein was spatulated and the graft spatulated.  The two were sewn end-to-end using continuous 6-0 Prolene suture.  The new segment of graft was then cut to the appropriate length for anastomosis to the old graft, which was done with continuous CD6 PTFE suture.  At completion there was a good thrill in the fistula. Hemostasis was obtained in the wound, and the wound was closed with a deep layer of 3-0 Vicryl and the skin closed with 4-0 Vicryl.  A sterile dressing was applied.  The patient tolerated the procedure well, was transferred to the recovery room in satisfactory condition.  All needle and sponge counts were correct. Dictated by:   Di Kindle Edilia Bo, M.D. Attending Physician:  Bennye Alm DD:  04/08/01 TD:  04/09/01 Job: [id number]

## 2010-05-24 NOTE — Op Note (Signed)
[place]. [facility]  Patient:    [name], [name] Visit Number: [id number] MRN: [id number]          Service Type: DSU Location: [facility] 2860 01 Attending Physician:  Bennye Alm Dictated by:   Larina Earthly, M.D. Proc. Date: 04/27/01 Admit Date:  04/08/2001 Discharge Date: 04/08/2001                             Operative Report  PREOPERATIVE DIAGNOSIS:  End-stage renal disease with occluded right forearm loop arteriovenous Gore-Tex graft.  POSTOPERATIVE DIAGNOSIS:  End-stage renal disease with occluded right forearm loop arteriovenous Gore-Tex graft.  PROCEDURE:  Thrombectomy and interposition jump graft revision to basilic vein of left forearm loop arteriovenous Gore-Tex graft.  SURGEON:  Larina Earthly, M.D.  ASSISTANT:  Tollie Pizza. Collins, P.A.-C.  ANESTHESIA:  MAC.  COMPLICATIONS:  None.  DISPOSITION:  To recovery room stable.  DESCRIPTION OF PROCEDURE:  The patient was taken to the operating room and placed in the supine position, where the area of the right arm was prepped and draped in the usual sterile fashion.  An incision made over the antecubital space, carried down to isolate the graft.  The graft was opened through a prior thrombectomy and revision site.  The venous anastomosis was thrombectomized.  The venous anastomosis itself was widely patent as well as into the cephalic vein.  The catheter could not pass the level of the shoulder.  This was flushed with heparinized saline and reoccluded.  The graft itself was thrombectomized.  The arterialized plug was removed, and good arterial inflow was obtained.  This was flushed with heparinized saline and reoccluded.  A separate incision was made over the medial above-elbow upper arm over the level of the basilic vein.  The patient had a large-caliber basilic vein.  An interposition jump graft was created from the level of the basilic vein to the old graft.  The graft was sewn  end-to-side to the basilic vein with a running 6-0 Prolene suture and then end-to-end to the old graft with a running 6-0 Prolene suture.  Clamps were removed, and an excellent thrill was noted.  The wounds were irrigated with saline and hemostased with electrocautery.  The wounds were closed with 3-0 Vicryl in the subcutaneous and subcuticular tissue, and benzoin and Steri-Strips were applied. Dictated by:   Larina Earthly, M.D. Attending Physician:  Bennye Alm DD:  04/27/01 TD:  04/27/01 Job: [id number]

## 2010-05-24 NOTE — Procedures (Signed)
NAMEMIKEISHA, LEMONDS              ACCOUNT NO.:  [id number]   MEDICAL RECORD NO.:  [id number]          PATIENT TYPE:  EMS   LOCATION:  ED                            FACILITY:  [facility]   PHYSICIAN:  Edward L. Juanetta Gosling, M.D.DATE OF BIRTH:  December 28, 1930   DATE OF PROCEDURE:  DATE OF DISCHARGE:  09/05/2005                                EKG INTERPRETATION   September 05, 2005, 8:56   The rhythm is what appears to be a supraventricular tachycardia with a rate  of about 140.  In one lead I see P waves that are biphasic.  QT interval was  prolonged.  The axis is very markedly abnormal.  There is slow R-wave  progression across the precordium and questionable evidence of anterior  infarction.  Abnormal electrocardiogram.      Oneal Deputy. Juanetta Gosling, M.D.  Electronically Signed     ELH/MEDQ  D:  09/09/2005  T:  09/09/2005  Job:  [id number]

## 2010-05-24 NOTE — Op Note (Signed)
[place]. [facility]  Patient:    [name], [name]                       MRN: [id number] Proc. Date: 03/02/00 Adm. Date:  [id number] Attending:  Bennye Alm                           Operative Report  PREOPERATIVE DIAGNOSIS:  Chronic renal failure.  POSTOPERATIVE DIAGNOSIS:  Chronic renal failure.  OPERATION PERFORMED:  New left upper arm arteriovenous fistula.  SURGEON:  Di Kindle. Edilia Bo, M.D.  ASSISTANT:  Dominica Severin, P.A.  ANESTHESIA:  Local with sedation.  DESCRIPTION OF PROCEDURE:  The patient was taken to the operating room and sedated by anesthesia.  The left upper extremity was prepped and draped in the usual sterile fashion.  After the skin was anesthetized with 1% lidocaine, a transverse incision was made at the antecubital level.  Here the cephalic vein was of good size and several branches were ligated between 2-0 silk ties.  The vein was the mobilized over for anastomosis to the bracheal artery.  This was dissected free beneath the fascia.  The patient was then heparinized.  The vein was divided distally and irrigated up nicely with heparinized saline.  I passed the Fogarty catheter the entire length without evidence of obstruction. The artery was then clamped proximally and distally.  A longitudinal arteriotomy was made.  The vein was sewn end-to-side to the artery using continuous 6-0 Prolene suture.  At the completion there was an excellent thrill in the fistula.  A proximal branch was ligated with a 2-0 silk tie and divided.  Hemostasis was obtained in the wound.  The wound was closed with a deep layer of 2-0 Vicryl and the skin closed with 4-0 Vicryl.  A sterile dressing was applied.  The patient tolerated the procedure well and was transferred to the recovery room in satisfactory condition.    The sponge and needle counts were correct.. DD:  03/02/00 TD:  03/02/00 Job: [id number]

## 2010-05-24 NOTE — Op Note (Signed)
[place]. [facility]  Patient:    [name], [name]                       MRN: [id number] Adm. Date:  [id number] Attending:  Toma Copier CC:         Wilber Bihari. Caryn Section, M.D.   Operative Report  PREOPERATIVE DIAGNOSIS:  End-stage renal disease.  POSTOPERATIVE DIAGNOSIS:  End-stage renal disease.  PROCEDURE:  Right internal jugular Ash catheter insertion.  SURGEON:  D. Karle Plumber, M.D.  ANESTHESIA:  1% Xylocaine with IV sedation.  DESCRIPTION OF PROCEDURE:  After prepping and draping the right chest, the area was infiltrated with 1% Xylocaine in the right neck.  The patient had been previously scanned to see the jugular vein.  The jugular vein was stuck at the border of the sternocleidomastoid, and through that was passed a guidewire under fluoroscopy to the right atrium.  A stab wound was made around the guidewire.  Another area was infiltrated with 1% Xylocaine laterally. Stab wound was made, and then from the lateral stab wound to the medial stab wound, a 24-inch Ash catheter was tunneled.  Over the guidewire was passed a 12, 14, and 16 dilator.  The 16 dilator had a peel-away sheath.  The guidewire and the 16 dilator were removed.  The peel-away sheath was clamped.  Then Ash catheter was inserted through the peel-away sheath.  The peel-away sheath was removed.  This was confirmed to be in the right atrium under fluoroscopy and was sutured in place with 3-0 nylon at the medial stab wound and then at the exit site with a horizontal mattress suture, interrupted 2-0 nylon in the suture cuff.  A _____ dressing was applied and the patient returned to the recovery room in stable condition. DD:  04/10/00 TD:  04/10/00 Job: [id number]

## 2010-05-24 NOTE — Procedures (Signed)
[name], [name]              ACCOUNT NO.:  [id number]   MEDICAL RECORD NO.:  [id number]          PATIENT TYPE:  AMB   LOCATION:  DAY                           FACILITY:  [facility]   PHYSICIAN:  Edward L. Juanetta Gosling, M.D.DATE OF BIRTH:  11/27/1930   DATE OF PROCEDURE:  10/07/2005  DATE OF DISCHARGE:  10/07/2005                                EKG INTERPRETATION   TIME:  2:29.   The rhythm is sinus rhythm with a rate in the 130s.  There is left axis  deviation.  Slow R-wave progression across precordium may indicate a  previous anterior infarction, and clinical correlation is suggested.  There  is probable left atrial enlargement.  The voltage is somewhat low.  Abnormal  electrocardiogram.      Oneal Deputy. Juanetta Gosling, M.D.  Electronically Signed     ELH/MEDQ  D:  10/15/2005  T:  10/16/2005  Job:  [id number]

## 2010-07-04 ENCOUNTER — Other Ambulatory Visit (HOSPITAL_COMMUNITY): Payer: Self-pay | Admitting: Nephrology

## 2010-07-04 DIAGNOSIS — Z139 Encounter for screening, unspecified: Secondary | ICD-10-CM

## 2010-07-11 ENCOUNTER — Ambulatory Visit (HOSPITAL_COMMUNITY)
Admission: RE | Admit: 2010-07-11 | Discharge: 2010-07-11 | Disposition: A | Discharge status: Home / Self Care | Payer: Medicare Other | Source: Ambulatory Visit | Attending: Nephrology | Admitting: Nephrology

## 2010-07-11 DIAGNOSIS — Z1231 Encounter for screening mammogram for malignant neoplasm of breast: Secondary | ICD-10-CM | POA: Insufficient documentation

## 2010-07-11 DIAGNOSIS — Z139 Encounter for screening, unspecified: Secondary | ICD-10-CM

## 2010-09-19 ENCOUNTER — Encounter: Payer: Self-pay | Admitting: Gastroenterology

## 2011-04-22 ENCOUNTER — Other Ambulatory Visit (HOSPITAL_COMMUNITY): Payer: Self-pay | Admitting: Family Medicine

## 2011-04-22 DIAGNOSIS — Z139 Encounter for screening, unspecified: Secondary | ICD-10-CM

## 2011-04-29 ENCOUNTER — Ambulatory Visit (HOSPITAL_COMMUNITY)
Admission: RE | Admit: 2011-04-29 | Discharge: 2011-04-29 | Disposition: A | Discharge status: Home / Self Care | Payer: Medicare Other | Source: Ambulatory Visit | Attending: Family Medicine | Admitting: Family Medicine

## 2011-04-29 DIAGNOSIS — M810 Age-related osteoporosis without current pathological fracture: Secondary | ICD-10-CM | POA: Insufficient documentation

## 2011-04-29 DIAGNOSIS — E559 Vitamin D deficiency, unspecified: Secondary | ICD-10-CM | POA: Insufficient documentation

## 2011-04-29 DIAGNOSIS — Z139 Encounter for screening, unspecified: Secondary | ICD-10-CM

## 2011-04-29 DIAGNOSIS — Z13820 Encounter for screening for osteoporosis: Secondary | ICD-10-CM | POA: Insufficient documentation

## 2011-04-29 DIAGNOSIS — Z90710 Acquired absence of both cervix and uterus: Secondary | ICD-10-CM | POA: Insufficient documentation

## 2011-04-29 DIAGNOSIS — Z780 Asymptomatic menopausal state: Secondary | ICD-10-CM | POA: Insufficient documentation

## 2011-04-29 DIAGNOSIS — Z940 Kidney transplant status: Secondary | ICD-10-CM | POA: Insufficient documentation

## 2013-01-15 ENCOUNTER — Inpatient Hospital Stay (HOSPITAL_COMMUNITY)
Admission: EM | Admit: 2013-01-15 | Discharge: 2013-01-20 | DRG: 871 | Disposition: A | Discharge status: Skilled Nursing Facility | Payer: Medicare PPO | Attending: Internal Medicine | Admitting: Internal Medicine

## 2013-01-15 ENCOUNTER — Inpatient Hospital Stay (HOSPITAL_COMMUNITY): Payer: Medicare PPO

## 2013-01-15 ENCOUNTER — Encounter (HOSPITAL_COMMUNITY): Payer: Self-pay | Admitting: Emergency Medicine

## 2013-01-15 ENCOUNTER — Emergency Department (HOSPITAL_COMMUNITY): Payer: Medicare PPO

## 2013-01-15 DIAGNOSIS — Z905 Acquired absence of kidney: Secondary | ICD-10-CM

## 2013-01-15 DIAGNOSIS — D126 Benign neoplasm of colon, unspecified: Secondary | ICD-10-CM

## 2013-01-15 DIAGNOSIS — J90 Pleural effusion, not elsewhere classified: Secondary | ICD-10-CM

## 2013-01-15 DIAGNOSIS — N19 Unspecified kidney failure: Secondary | ICD-10-CM

## 2013-01-15 DIAGNOSIS — Z8679 Personal history of other diseases of the circulatory system: Secondary | ICD-10-CM

## 2013-01-15 DIAGNOSIS — R6520 Severe sepsis without septic shock: Secondary | ICD-10-CM

## 2013-01-15 DIAGNOSIS — K3184 Gastroparesis: Secondary | ICD-10-CM

## 2013-01-15 DIAGNOSIS — Z8611 Personal history of tuberculosis: Secondary | ICD-10-CM

## 2013-01-15 DIAGNOSIS — Z940 Kidney transplant status: Secondary | ICD-10-CM

## 2013-01-15 DIAGNOSIS — I509 Heart failure, unspecified: Secondary | ICD-10-CM

## 2013-01-15 DIAGNOSIS — J9600 Acute respiratory failure, unspecified whether with hypoxia or hypercapnia: Secondary | ICD-10-CM | POA: Diagnosis present

## 2013-01-15 DIAGNOSIS — I10 Essential (primary) hypertension: Secondary | ICD-10-CM

## 2013-01-15 DIAGNOSIS — K219 Gastro-esophageal reflux disease without esophagitis: Secondary | ICD-10-CM

## 2013-01-15 DIAGNOSIS — M6530 Trigger finger, unspecified finger: Secondary | ICD-10-CM

## 2013-01-15 DIAGNOSIS — Z8719 Personal history of other diseases of the digestive system: Secondary | ICD-10-CM

## 2013-01-15 DIAGNOSIS — A419 Sepsis, unspecified organism: Principal | ICD-10-CM

## 2013-01-15 DIAGNOSIS — R627 Adult failure to thrive: Secondary | ICD-10-CM | POA: Diagnosis present

## 2013-01-15 DIAGNOSIS — E119 Type 2 diabetes mellitus without complications: Secondary | ICD-10-CM | POA: Diagnosis present

## 2013-01-15 DIAGNOSIS — G9340 Encephalopathy, unspecified: Secondary | ICD-10-CM

## 2013-01-15 DIAGNOSIS — N390 Urinary tract infection, site not specified: Secondary | ICD-10-CM

## 2013-01-15 DIAGNOSIS — Z9181 History of falling: Secondary | ICD-10-CM

## 2013-01-15 DIAGNOSIS — I82509 Chronic embolism and thrombosis of unspecified deep veins of unspecified lower extremity: Secondary | ICD-10-CM | POA: Diagnosis present

## 2013-01-15 DIAGNOSIS — Z85828 Personal history of other malignant neoplasm of skin: Secondary | ICD-10-CM

## 2013-01-15 DIAGNOSIS — J189 Pneumonia, unspecified organism: Secondary | ICD-10-CM

## 2013-01-15 DIAGNOSIS — F0390 Unspecified dementia without behavioral disturbance: Secondary | ICD-10-CM | POA: Diagnosis present

## 2013-01-15 DIAGNOSIS — K5900 Constipation, unspecified: Secondary | ICD-10-CM

## 2013-01-15 DIAGNOSIS — R197 Diarrhea, unspecified: Secondary | ICD-10-CM | POA: Diagnosis present

## 2013-01-15 DIAGNOSIS — F411 Generalized anxiety disorder: Secondary | ICD-10-CM | POA: Diagnosis present

## 2013-01-15 DIAGNOSIS — J9601 Acute respiratory failure with hypoxia: Secondary | ICD-10-CM

## 2013-01-15 DIAGNOSIS — I5031 Acute diastolic (congestive) heart failure: Secondary | ICD-10-CM

## 2013-01-15 DIAGNOSIS — S42309A Unspecified fracture of shaft of humerus, unspecified arm, initial encounter for closed fracture: Secondary | ICD-10-CM

## 2013-01-15 DIAGNOSIS — Z66 Do not resuscitate: Secondary | ICD-10-CM | POA: Diagnosis present

## 2013-01-15 DIAGNOSIS — G9349 Other encephalopathy: Secondary | ICD-10-CM | POA: Diagnosis present

## 2013-01-15 DIAGNOSIS — Z86718 Personal history of other venous thrombosis and embolism: Secondary | ICD-10-CM

## 2013-01-15 HISTORY — DX: Type 2 diabetes mellitus without complications: E11.9

## 2013-01-15 HISTORY — DX: Zoster without complications: B02.9

## 2013-01-15 HISTORY — DX: End stage renal disease: N18.6

## 2013-01-15 LAB — COMPREHENSIVE METABOLIC PANEL
ALBUMIN: 3.9 g/dL (ref 3.5–5.2)
ALT: 11 U/L (ref 0–35)
AST: 25 U/L (ref 0–37)
Alkaline Phosphatase: 72 U/L (ref 39–117)
BILIRUBIN TOTAL: 0.5 mg/dL (ref 0.3–1.2)
BUN: 30 mg/dL — ABNORMAL HIGH (ref 6–23)
CHLORIDE: 100 meq/L (ref 96–112)
CO2: 28 mEq/L (ref 19–32)
Calcium: 10.7 mg/dL — ABNORMAL HIGH (ref 8.4–10.5)
Creatinine, Ser: 1.24 mg/dL — ABNORMAL HIGH (ref 0.50–1.10)
GFR calc non Af Amer: 40 mL/min — ABNORMAL LOW (ref >90)
GFR, EST AFRICAN AMERICAN: 46 mL/min — AB (ref >90)
GLUCOSE: 217 mg/dL — AB (ref 70–99)
POTASSIUM: 4.9 meq/L (ref 3.7–5.3)
Sodium: 140 mEq/L (ref 137–147)
Total Protein: 7.3 g/dL (ref 6.0–8.3)

## 2013-01-15 LAB — MAGNESIUM: Magnesium: 2 mg/dL (ref 1.5–2.5)

## 2013-01-15 LAB — URINALYSIS, ROUTINE W REFLEX MICROSCOPIC
Bilirubin Urine: NEGATIVE
GLUCOSE, UA: NEGATIVE mg/dL
Ketones, ur: NEGATIVE mg/dL
Nitrite: NEGATIVE
Protein, ur: 100 mg/dL — AB
Specific Gravity, Urine: 1.02 (ref 1.005–1.030)
Urobilinogen, UA: 0.2 mg/dL (ref 0.0–1.0)
pH: 5.5 (ref 5.0–8.0)

## 2013-01-15 LAB — LACTIC ACID, PLASMA: Lactic Acid, Venous: 1.8 mmol/L (ref 0.5–2.2)

## 2013-01-15 LAB — CBC WITH DIFFERENTIAL/PLATELET
Basophils Absolute: 0 10*3/uL (ref 0.0–0.1)
Basophils Relative: 0 % (ref 0–1)
Eosinophils Absolute: 0.1 10*3/uL (ref 0.0–0.7)
Eosinophils Relative: 3 % (ref 0–5)
HCT: 40.5 % (ref 36.0–46.0)
HEMOGLOBIN: 12.7 g/dL (ref 12.0–15.0)
LYMPHS ABS: 1.1 10*3/uL (ref 0.7–4.0)
Lymphocytes Relative: 28 % (ref 12–46)
MCH: 27.3 pg (ref 26.0–34.0)
MCHC: 31.4 g/dL (ref 30.0–36.0)
MCV: 87.1 fL (ref 78.0–100.0)
MONOS PCT: 8 % (ref 3–12)
Monocytes Absolute: 0.3 10*3/uL (ref 0.1–1.0)
NEUTROS ABS: 2.4 10*3/uL (ref 1.7–7.7)
NEUTROS PCT: 61 % (ref 43–77)
Platelets: 177 10*3/uL (ref 150–400)
RBC: 4.65 MIL/uL (ref 3.87–5.11)
RDW: 16.2 % — ABNORMAL HIGH (ref 11.5–15.5)
WBC: 4 10*3/uL (ref 4.0–10.5)

## 2013-01-15 LAB — TROPONIN I
Troponin I: <0.3 ng/mL (ref <0.30)
Troponin I: <0.3 ng/mL (ref <0.30)
Troponin I: <0.3 ng/mL (ref <0.30)
Troponin I: <0.3 ng/mL (ref <0.30)

## 2013-01-15 LAB — PROTIME-INR
INR: 0.98 (ref 0.00–1.49)
Prothrombin Time: 12.8 seconds (ref 11.6–15.2)

## 2013-01-15 LAB — GLUCOSE, CAPILLARY: GLUCOSE-CAPILLARY: 189 mg/dL — AB (ref 70–99)

## 2013-01-15 LAB — MRSA PCR SCREENING: MRSA BY PCR: NEGATIVE

## 2013-01-15 LAB — URINE MICROSCOPIC-ADD ON

## 2013-01-15 LAB — APTT: aPTT: 35 seconds (ref 24–37)

## 2013-01-15 LAB — PHOSPHORUS: Phosphorus: 4 mg/dL (ref 2.3–4.6)

## 2013-01-15 LAB — PRO B NATRIURETIC PEPTIDE: Pro B Natriuretic peptide (BNP): 13226 pg/mL — ABNORMAL HIGH (ref 0–450)

## 2013-01-15 MED ORDER — CHLORHEXIDINE GLUCONATE 0.12 % MT SOLN
15.0000 mL | Freq: Two times a day (BID) | OROMUCOSAL | Status: DC
  Filled 2013-01-15: qty 15

## 2013-01-15 MED ORDER — ONDANSETRON HCL 4 MG PO TABS: 4.0000 mg | ORAL_TABLET | Freq: Four times a day (QID) | ORAL | Status: DC | PRN

## 2013-01-15 MED ORDER — ACETAMINOPHEN 325 MG PO TABS: 650.0000 mg | ORAL_TABLET | Freq: Four times a day (QID) | ORAL | Status: DC | PRN

## 2013-01-15 MED ORDER — SODIUM CHLORIDE 0.9 % IJ SOLN: 3.0000 mL | INTRAMUSCULAR | Status: DC | PRN

## 2013-01-15 MED ORDER — ONDANSETRON HCL 4 MG/2ML IJ SOLN: 4.0000 mg | Freq: Four times a day (QID) | INTRAMUSCULAR | Status: DC | PRN

## 2013-01-15 MED ORDER — VANCOMYCIN HCL IN DEXTROSE 1-5 GM/200ML-% IV SOLN
1000.0000 mg | Freq: Once | INTRAVENOUS | Status: AC
  Administered 2013-01-15: 1000 mg via INTRAVENOUS
  Filled 2013-01-15: qty 200

## 2013-01-15 MED ORDER — SIROLIMUS 1 MG PO TABS
1.0000 mg | ORAL_TABLET | Freq: Every day | ORAL | Status: DC
  Administered 2013-01-15 – 2013-01-20 (×6): 1 mg via ORAL
  Filled 2013-01-15 (×9): qty 1

## 2013-01-15 MED ORDER — PIPERACILLIN-TAZOBACTAM 3.375 G IVPB 30 MIN
3.3750 g | Freq: Once | INTRAVENOUS | Status: AC
  Administered 2013-01-15: 3.375 g via INTRAVENOUS
  Filled 2013-01-15 (×2): qty 50

## 2013-01-15 MED ORDER — SODIUM CHLORIDE 0.9 % IJ SOLN
3.0000 mL | Freq: Two times a day (BID) | INTRAMUSCULAR | Status: DC
  Administered 2013-01-16 – 2013-01-20 (×4): 3 mL via INTRAVENOUS

## 2013-01-15 MED ORDER — PIPERACILLIN-TAZOBACTAM 3.375 G IVPB
3.3750 g | Freq: Three times a day (TID) | INTRAVENOUS | Status: DC
  Administered 2013-01-15 – 2013-01-18 (×9): 3.375 g via INTRAVENOUS
  Filled 2013-01-15 (×13): qty 50

## 2013-01-15 MED ORDER — FUROSEMIDE 10 MG/ML IJ SOLN
40.0000 mg | Freq: Once | INTRAMUSCULAR | Status: AC
  Administered 2013-01-15: 40 mg via INTRAVENOUS
  Filled 2013-01-15: qty 4

## 2013-01-15 MED ORDER — SODIUM CHLORIDE 0.9 % IJ SOLN
3.0000 mL | Freq: Two times a day (BID) | INTRAMUSCULAR | Status: DC
  Administered 2013-01-16 – 2013-01-19 (×3): 3 mL via INTRAVENOUS

## 2013-01-15 MED ORDER — HEPARIN SODIUM (PORCINE) 5000 UNIT/ML IJ SOLN
5000.0000 [IU] | Freq: Three times a day (TID) | INTRAMUSCULAR | Status: DC
  Administered 2013-01-15 – 2013-01-20 (×15): 5000 [IU] via SUBCUTANEOUS
  Filled 2013-01-15 (×15): qty 1

## 2013-01-15 MED ORDER — LORAZEPAM 2 MG/ML IJ SOLN
0.5000 mg | Freq: Four times a day (QID) | INTRAMUSCULAR | Status: DC | PRN
  Administered 2013-01-16 – 2013-01-19 (×4): 0.5 mg via INTRAVENOUS
  Filled 2013-01-15 (×4): qty 1

## 2013-01-15 MED ORDER — BIOTENE DRY MOUTH MT LIQD
15.0000 mL | Freq: Two times a day (BID) | OROMUCOSAL | Status: DC
  Administered 2013-01-15 (×2): 15 mL via OROMUCOSAL

## 2013-01-15 MED ORDER — TACROLIMUS 0.5 MG PO CAPS
0.5000 mg | ORAL_CAPSULE | Freq: Two times a day (BID) | ORAL | Status: DC
  Administered 2013-01-15 – 2013-01-20 (×11): 0.5 mg via ORAL
  Filled 2013-01-15 (×14): qty 1

## 2013-01-15 MED ORDER — SODIUM CHLORIDE 0.9 % IV SOLN: 250.0000 mL | INTRAVENOUS | Status: DC | PRN

## 2013-01-15 MED ORDER — TACROLIMUS 1 MG PO CAPS
1.0000 mg | ORAL_CAPSULE | Freq: Two times a day (BID) | ORAL | Status: DC
  Administered 2013-01-15 – 2013-01-20 (×11): 1 mg via ORAL
  Filled 2013-01-15 (×13): qty 1

## 2013-01-15 MED ORDER — ACETAMINOPHEN 650 MG RE SUPP: 650.0000 mg | Freq: Four times a day (QID) | RECTAL | Status: DC | PRN

## 2013-01-15 MED ORDER — SODIUM CHLORIDE 0.9 % IV SOLN
Freq: Once | INTRAVENOUS | Status: AC
  Administered 2013-01-15: 06:00:00 via INTRAVENOUS

## 2013-01-15 MED ORDER — FUROSEMIDE 10 MG/ML IJ SOLN
40.0000 mg | Freq: Four times a day (QID) | INTRAMUSCULAR | Status: DC
Start: 2013-01-15 — End: 2013-01-16
  Administered 2013-01-15 – 2013-01-16 (×4): 40 mg via INTRAVENOUS
  Filled 2013-01-15 (×5): qty 4

## 2013-01-15 NOTE — ED Notes (Signed)
Report given to Michelle, RN in ICU.

## 2013-01-15 NOTE — Progress Notes (Signed)
Dr Sarajane Jews paged and made aware of bilateral leg [place] results.

## 2013-01-15 NOTE — ED Notes (Signed)
Per Dr. Caryn Section pt is to be held in er until seen by her.

## 2013-01-15 NOTE — ED Notes (Addendum)
Received report on pt, family at bedside talking to pt, pt alert, able to answer questions but speech is difficult to understand, oral mucous membranes dry, skin very dry, flaking off at times. Bilateral swelling noted to lower extremities,

## 2013-01-15 NOTE — Progress Notes (Signed)
ANTIBIOTIC CONSULT NOTE - INITIAL  Pharmacy Consult for Zosyn Indication: rule out sepsis, UTI  Allergies  Allergen Reactions  . Codeine     REACTION: UNKNOWN REACTION    Patient Measurements: Height: 5\' 8"  (172.7 cm) Weight: 163 lb 9.3 oz (74.2 kg) IBW/kg (Calculated) : 63.9  Vital Signs: Temp: 96.9 F (36.1 C) (01/10 0945) Temp src: Rectal (01/10 0945) BP: 153/50 mmHg (01/10 0945) Pulse Rate: 68 (01/10 0945) Intake/Output from previous day:   Intake/Output from this shift:    Labs:  Recent Labs  01/15/13 0452  WBC 4.0  HGB 12.7  PLT 177  CREATININE 1.24*   Estimated Creatinine Clearance: 36.5 ml/min (by C-G formula based on Cr of 1.24). No results found for this basename: VANCOTROUGH, VANCOPEAK, VANCORANDOM, GENTTROUGH, GENTPEAK, GENTRANDOM, TOBRATROUGH, TOBRAPEAK, TOBRARND, AMIKACINPEAK, AMIKACINTROU, AMIKACIN,  in the last 72 hours   Microbiology: Recent Results (from the past 720 hour(s))  CULTURE, BLOOD (ROUTINE X 2)     Status: None   Collection Time    01/15/13  5:54 AM      Result Value Range Status   Specimen Description BLOOD LEFT HAND   Final   Special Requests     Final   Value: BOTTLES DRAWN AEROBIC AND ANAEROBIC 6CC EACH BOTTLE   Culture PENDING   Incomplete   Report Status PENDING   Incomplete  CULTURE, BLOOD (ROUTINE X 2)     Status: None   Collection Time    01/15/13  6:01 AM      Result Value Range Status   Specimen Description BLOOD LEFT ARM   Final   Special Requests BOTTLES DRAWN AEROBIC ONLY [place]   Final   Culture PENDING   Incomplete   Report Status PENDING   Incomplete    Medical History: Past Medical History  Diagnosis Date  . ESRD (end stage renal disease)     etiology unknown, history of dialysis  . Shingles     October 2014  . Diabetes mellitus without complication     Medications:  Prescriptions prior to admission  Medication Sig Dispense Refill  . ALPRAZolam (XANAX) 1 MG tablet Take 1 mg by mouth at  bedtime.      Marland Kitchen amLODipine (NORVASC) 10 MG tablet Take 10 mg by mouth daily.      . calcitRIOL (ROCALTROL) 0.25 MCG capsule Take 0.25 mcg by mouth every other day.      . calcium carbonate (OS-CAL) 600 MG TABS tablet Take 600 mg by mouth 2 (two) times daily with a meal.      . diphenhydramine-acetaminophen (TYLENOL PM) 25-500 MG TABS Take 1 tablet by mouth at bedtime as needed.      . furosemide (LASIX) 40 MG tablet Take 80 mg by mouth.      . gabapentin (NEURONTIN) 100 MG capsule Take 100 mg by mouth at bedtime.      . magnesium oxide (MAG-OX) 400 MG tablet Take 400 mg by mouth daily.      . metoprolol (LOPRESSOR) 50 MG tablet Take 75 mg by mouth 2 (two) times daily.      . Multiple Vitamin (MULTIVITAMIN) tablet Take 1 tablet by mouth daily.      . sirolimus (RAPAMUNE) 1 MG tablet Take 1 mg by mouth daily.      . tacrolimus (PROGRAF) 0.5 MG capsule Take 0.5 mg by mouth 2 (two) times daily.      . tacrolimus (PROGRAF) 1 MG capsule Take 1 mg by mouth 2 (  two) times daily.       Assessment: Okay for Protocol, HX Kidney transplant.  Vancomycin 1/10 X 1 Zosyn 1/10 >>  Goal of Therapy:  Eradicate infection.   Plan:  Zosyn 3.375gm IV every 8 hours. Follow-up micro data, labs, vitals.  Biagio Quint R 01/15/2013,10:13 AM

## 2013-01-15 NOTE — ED Notes (Signed)
Pt breathing has become more labored, when I asked pt questions, pt would only respond with opening her mouth attempting to talk, no sound coming from mouth,

## 2013-01-15 NOTE — H&P (Signed)
History and Physical  [name] [id number] DOB: 1930-09-05 DOA: 01/15/2013  Referring physician: Noemi Chapel, MD in ED PCP: [name]., MD   Chief Complaint: Multiple falls, confusion, failure to thrive, shortness of breath  HPI:  80 year old woman with history of end-stage renal disease, status post kidney transplant 2008 on antirejection therapy presented to the hospital with multiple complaints including falls, confusion for the last several months, failure to thrive, increasing lower extremity edema shortness of breath. Initial evaluation was notable for tachypnea, hypoxia, hypothermia and laboratory testing revealed UTI, acute congestive heart failure. Imaging suggested acute congestive heart failure, some degree of pleural effusions. Initial impression sepsis, UTI, acute congestive heart failure, subacute encephalopathy.  History obtained from husband and daughter at bedside. The patient has baseline dementia with increased confusion for the last 2 months. She had shingles under her left breast October 2014 and had difficulty recovering but at baseline is able to ambulate with a walker and perform her own ADLs. She has been progressively declining over the last 2 months but over the last 10 days has had a precipitious decline and has told her family she thinks she is dying. Very poor oral intake, increasing lower extremity edema, increasing shortness of breath, increased confusion and multiple falls at home for the last 2 weeks. No known fever or other systemic complaints. She fell again last night but did not take an extra dose of Xanax and Tylenol PM. She is become so generally weak at this point that she is nearly bedbound. Of note she was also just started on gabapentin within the last 2 weeks. Normally she occupies herself by playing computer but has had no interest in doing so for the last 10 days  In the emergency department temperature 96.2 rectal, RR 18-40, hypertensive,  hypoxic. BUN 30, creatinine 1.24. Troponin negative. BNP 13226, lactic acid normal. CBC unremarkable. UA grossly positive. BC and UC sent, treated with Vancomycin and Zosyn. CXR with L>>R pleural effusion, fluid in fissure right, CHF. CT head and C-spine no acute process. CT abdomen/pelvis suggested large bilateral pleural effusions. EKG SR, PACs, no acute changes. Second EKG SR, no acute changes, no PACs.  Review of Systems:  Negative for fever, visual changes, sore throat, rash, new muscle aches, chest pain, dysuria, bleeding, n/v/abdominal pain.  Positive for chills.  Past Medical History  Diagnosis Date  . ESRD (end stage renal disease)     etiology unknown, history of dialysis  . Shingles     October 2014  . Diabetes mellitus without complication     Past Surgical History  Procedure Laterality Date  . Kidney transplant      about 2008  . Nephrectomy transplanted organ    . Dialysis fistula creation Left   . Prostetic eye Left     cornea damage  . Left arm fracture    . Cholecystectomy    . Abdominal hysterectomy      Social History:  reports that she has never smoked. She does not have any smokeless tobacco history on file. She reports that she does not drink alcohol or use illicit drugs.  Allergies  Allergen Reactions  . Codeine     REACTION: UNKNOWN REACTION    Family History  Problem Relation Age of Onset  . Cancer Father     colon     Prior to Admission medications   Medication Sig Start Date End Date Taking? Authorizing Provider  ALPRAZolam Duanne Moron) 1 MG tablet Take 1 mg by mouth  at bedtime.   Yes Historical Provider, MD  amLODipine (NORVASC) 10 MG tablet Take 10 mg by mouth daily.   Yes Historical Provider, MD  calcitRIOL (ROCALTROL) 0.25 MCG capsule Take 0.25 mcg by mouth every other day.   Yes Historical Provider, MD  calcium carbonate (OS-CAL) 600 MG TABS tablet Take 600 mg by mouth 2 (two) times daily with a meal.   Yes Historical Provider, MD   diphenhydramine-acetaminophen (TYLENOL PM) 25-500 MG TABS Take 1 tablet by mouth at bedtime as needed.   Yes Historical Provider, MD  furosemide (LASIX) 40 MG tablet Take 80 mg by mouth.   Yes Historical Provider, MD  gabapentin (NEURONTIN) 100 MG capsule Take 100 mg by mouth at bedtime.   Yes Historical Provider, MD  magnesium oxide (MAG-OX) 400 MG tablet Take 400 mg by mouth daily.   Yes Historical Provider, MD  metoprolol (LOPRESSOR) 50 MG tablet Take 75 mg by mouth 2 (two) times daily.   Yes Historical Provider, MD  Multiple Vitamin (MULTIVITAMIN) tablet Take 1 tablet by mouth daily.   Yes Historical Provider, MD  sirolimus (RAPAMUNE) 1 MG tablet Take 1 mg by mouth daily.   Yes Historical Provider, MD  tacrolimus (PROGRAF) 0.5 MG capsule Take 0.5 mg by mouth 2 (two) times daily.   Yes Historical Provider, MD  tacrolimus (PROGRAF) 1 MG capsule Take 1 mg by mouth 2 (two) times daily.   Yes Historical Provider, MD   Physical Exam: Filed Vitals:   01/15/13 0424 01/15/13 0500 01/15/13 0600 01/15/13 0700  BP: 161/56 173/59 168/55 161/54  Pulse: 66 67 65 66  Temp:  96.5 F (35.8 C)  96.2 F (35.7 C)  TempSrc:    Rectal  Resp: 18 30 22 24   Weight: 79.379 kg (175 lb)     SpO2: 95% 97% 99% 94%    General: Examined in the emergency department. Retail banker in place. She appears critically ill, tachypneic. She is alert and follows simple commands but is unable to speak secondary to dry mouth. Eyes: Right pupil round, reactive. Lids appear unremarkable. Left eye prosthesis in place. ENT: grossly normal hearing, lips & tongue. Mucous membranes somewhat dry. No thrush. Neck: no LAD, masses or thyromegaly Cardiovascular: RRR, no m/r/g. 2-3+ bilateral lower extremity edema, right greater than left Respiratory: Somewhat diminished on the left compared to the right but clear without wheezes, rales or rhonchi. Fair air movement. Tachypneic but only mild dyspnea seen. Mild to moderate increased  respiratory effort. No nasal flaring or retractions. Abdomen: soft, ntnd Skin: no rash or induration see. Well-healed lesions from shingles under her left breast. She has multiple bruises over various parts of her body including her face. She has what appears to be an abrasion, a lesion on her right fourth toe. Musculoskeletal: grossly normal tone BUE/BLE. Moves all extremities to command. Psychiatric: Difficult to assess but by history is confused. Neurologic: grossly non-focal.  Wt Readings from Last 3 Encounters:  01/15/13 79.379 kg (175 lb)  06/18/09 79.379 kg (175 lb)  03/12/09 81.647 kg (180 lb)    Labs on Admission:  Basic Metabolic Panel:  Recent Labs Lab 01/15/13 0452  NA 140  K 4.9  CL 100  CO2 28  GLUCOSE 217*  BUN 30*  CREATININE 1.24*  CALCIUM 10.7*  MG 2.0  PHOS 4.0    Liver Function Tests:  Recent Labs Lab 01/15/13 0452  AST 25  ALT 11  ALKPHOS 72  BILITOT 0.5  PROT 7.3  ALBUMIN 3.9  CBC:  Recent Labs Lab 01/15/13 0452  WBC 4.0  NEUTROABS 2.4  HGB 12.7  HCT 40.5  MCV 87.1  PLT 177    Cardiac Enzymes:  Recent Labs Lab 01/15/13 0452  TROPONINI <0.30    Recent Labs  01/15/13 0516  PROBNP 13226.0*    CBG:  Recent Labs Lab 01/15/13 0425  GLUCAP 189*     Radiological Exams on Admission: Ct Abdomen Pelvis Wo Contrast  01/15/2013   CLINICAL DATA:  Fall.  Altered mental status.  EXAM: CT ABDOMEN AND PELVIS WITHOUT CONTRAST  TECHNIQUE: Multidetector CT imaging of the abdomen and pelvis was performed following the standard protocol without intravenous contrast.  COMPARISON:  None.  FINDINGS: The patient has large bilateral pleural effusions. There is cardiomegaly. No pericardial effusion. Compressive atelectasis in lung bases is noted.  The right kidney is markedly atrophic with multiple cysts identified. There is a cystic structure in the left upper quadrant with dense rim calcification which may represent the patient's left  kidney or adrenal gland. The left adrenal gland is not definitely visualized. There is a right lower quadrant renal transplant with 2-3 small lobules of gas the renal collecting system likely related to Foley catheterization. The patient is status post cholecystectomy. The liver, spleen and pancreas are unremarkable. Extensive atherosclerotic vascular disease is seen. The patient is status post hysterectomy. There is a trace amount of free pelvic fluid. The stomach and small and large bowel appear normal. Diffuse body wall edema is noted. The patient is status post hysterectomy. No lymphadenopathy is seen. No fracture is seen. No lytic or sclerotic bony lesion is seen with multilevel lumbar spondylosis noted.  IMPRESSION: Large appearing bilateral pleural effusions with associated compressive basilar atelectasis.  Cardiomegaly.  Trace amount of free pelvic fluid is nonspecific but could be due to IV fluid resuscitation or anasarca with diffuse body wall edema noted.  Tiny amount of air in the right lower quadrant renal transplant is likely related to Foley catheterization.   Electronically Signed   By: Inge Rise M.D.   On: 01/15/2013 05:52   Ct Head Wo Contrast  01/15/2013   CLINICAL DATA:  Altered mental status.  Status post fall.  EXAM: CT HEAD WITHOUT CONTRAST  CT CERVICAL SPINE WITHOUT CONTRAST  TECHNIQUE: Multidetector CT imaging of the head and cervical spine was performed following the standard protocol without intravenous contrast. Multiplanar CT image reconstructions of the cervical spine were also generated.  COMPARISON:  Cervical spine CT scan 02/28/2003. Brain MRI 07/23/2005.  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 Chronic microvascular ischemic change is seen. No evidence of acute abnormality including infarction, hemorrhage, mass lesion, mass effect, midline shift or abnormal extra-axial fluid collection. Mucosal thickening bilateral maxillary  sinuses and is seen. There is some scattered ethmoid air cell disease. Prosthetic left globe is noted. The calvarium is intact.  CT CERVICAL SPINE FINDINGS  No fracture is identified. Mild anterolisthesis C4 on C7 and C7 on T1 due to facet arthropathy is noted. Anterior endplate spurring is seen. The patient has large appearing bilateral pleural effusions. Interlobular septal thickening in the apices is noted.  IMPRESSION: No acute finding head or cervical spine.  Chronic microvascular ischemic change and remote right parieto-occipital infarct.  Large appearing bilateral pleural effusions and interlobular septal thickening compatible with pulmonary edema.   Electronically Signed   By: Inge Rise M.D.   On: 01/15/2013 05:46   Ct Cervical Spine Wo Contrast  01/15/2013  CLINICAL DATA:  Altered mental status.  Status post fall.  EXAM: CT HEAD WITHOUT CONTRAST  CT CERVICAL SPINE WITHOUT CONTRAST  TECHNIQUE: Multidetector CT imaging of the head and cervical spine was performed following the standard protocol without intravenous contrast. Multiplanar CT image reconstructions of the cervical spine were also generated.  COMPARISON:  Cervical spine CT scan 02/28/2003. Brain MRI 07/23/2005.  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 Chronic microvascular ischemic change is seen. No evidence of acute abnormality including infarction, hemorrhage, mass lesion, mass effect, midline shift or abnormal extra-axial fluid collection. Mucosal thickening bilateral maxillary sinuses and is seen. There is some scattered ethmoid air cell disease. Prosthetic left globe is noted. The calvarium is intact.  CT CERVICAL SPINE FINDINGS  No fracture is identified. Mild anterolisthesis C4 on C7 and C7 on T1 due to facet arthropathy is noted. Anterior endplate spurring is seen. The patient has large appearing bilateral pleural effusions. Interlobular septal thickening in the apices is noted.   IMPRESSION: No acute finding head or cervical spine.  Chronic microvascular ischemic change and remote right parieto-occipital infarct.  Large appearing bilateral pleural effusions and interlobular septal thickening compatible with pulmonary edema.   Electronically Signed   By: Inge Rise M.D.   On: 01/15/2013 05:46   Dg Chest Port 1 View  01/15/2013   CLINICAL DATA:  Altered mental status.  Fall, weakness.  EXAM: PORTABLE CHEST - 1 VIEW  COMPARISON:  Plain film of the chest 05/06/2007.  FINDINGS: There is extensive bilateral airspace disease. Pleural effusions are identified and appear greater on the right. Cardiomegaly is seen.  IMPRESSION: Appears the chest most compatible with congestive heart failure with associated pleural effusions and basilar atelectasis.   Electronically Signed   By: Inge Rise M.D.   On: 01/15/2013 05:05    EKG: Independently reviewed. As above   Principal Problem:   Sepsis secondary to UTI Active Problems:   CHF (congestive heart failure)   Acute respiratory failure with hypoxia   Encephalopathy   Assessment/Plan 80 year old woman with history of end-stage renal disease, status post kidney transplant but otherwise no significant medical history who presented with 10 day history of precipitous decline and was admitted for UTI, sepsis, acute congestive heart failure.  1. UTI with sepsis with associated tachypnea, hypothermia. 2. Acute hypoxic respiratory failure, secondary to CHF and sepsis. 3. Acute congestive heart failure, type unknown. 4. Bilateral pleural effusions, left greater than right. 5. Fall at home, recurrent falls x7 days. 6. FTT, poor oral intake 7. Subacute encephalopathy for several months superimposed on chronic dementia. This appears to be slightly worsening, secondary to dementia with superimposed infection 8. Generalized weakness 9. RLE edema, h/o DVT. 10. S/p renal transplant on Prograf and Rapamune   The patient is  critically ill and her prognosis is guarded. Survival doubtful. I discussed above with husband and daughter at bedside. They would like conservative measures including antibiotics and Lasix but request DO NOT RESUSCITATE/DO NOT INTUBATE status, no heroic measures. They understand survival is doubtful and patient fails to improve would want comfort measures..  Empiric antibiotics for sepsis.  IV Lasix, daily weights, I./, 2-D echocardiogram, serial cardiac enzymes. No history to suggest ACS.  Chest x-ray independently reviewed. The effusions appear to be mild, moderate. She has significant interstitial edema and I think her respiratory failure is primarily from heart failure as evidence by exam and supported by imaging do not think thoracentesis indicated at this point and family is  not what to put her through any invasive measures at this point.  Bilateral lower extremity Dopplers blood DVT is doubted.  Ativan PRN anxiety, h/o severe anxiety  Code Status: DNR, DNI  DVT prophylaxis: Lovenox Family Communication: as above Disposition Plan/Anticipated LOS: admit SDU, 3-5 days  Time spent: 65 minutes  Murray Hodgkins, MD  [facility] Pager [phone] 01/15/2013, 7:19 AM

## 2013-01-15 NOTE — ED Notes (Signed)
Per Dr. Sabra Heck c-spine has been cleared, c-collar removed,

## 2013-01-15 NOTE — ED Provider Notes (Signed)
CSN: [id number]     Arrival date & time 01/15/13  0413 History   First MD Initiated Contact with Patient 01/15/13 0411     Chief Complaint  Patient presents with  . Fall   (Consider location/radiation/quality/duration/timing/severity/associated sxs/prior Treatment) HPI Comments: 80 year old female, history of renal failure status post kidney transplant, taking Prograf and Rapamune who presents with a complaint of recurrent falls and altered mental status. According to the family members the patient has had several falls over the last 7 days, one of these resulted in a head injury when she struck the left side of her face, this was an unwitnessed fall but according to family members she has had no vomiting, no seizure activity. She has had a progressive altered mental status, she has had a rapid decline over the last 2 months since being diagnosed with shingles of her left chest wall, at the time she was taking opiate medications and family member stated that she was incoherent because of the medications. She has not had pain medicines in over one month. She does take nightly Xanax as well as Tylenol PM and this evening took twice as much Xanax as usual. Her husband was helping her get from the bed to the bathroom when she became acutely weak and fell to the ground, she did not hit her head, he was able to assist her to the ground atraumatically. Family members report that she is unable to ambulate without significant assistance, she is close to being bed bound. She is eating but has very little amounts, she is passing urine and is on fluid pills. Family members also note that she has had right lower extremity swelling which has been worse recently, she has a distant history of venous thromboembolism but is not currently on anticoagulant therapy.  The patient was transported by paramedics with a cervical collar and backboard  The patient is unable to contribute to her history due to her altered mental  status.  Patient is a 80 y.o. female presenting with fall. The history is provided by the spouse, a relative and medical records.  Fall      Past Medical History  Diagnosis Date  . Renal disorder   . Shingles    Past Surgical History  Procedure Laterality Date  . Kidney transplant    . Nephrectomy transplanted organ    . Dialysis fistula creation Left   . Prostetic eye Left    No family history on file. History  Substance Use Topics  . Smoking status: Never Smoker   . Smokeless tobacco: Not on file  . Alcohol Use: No   OB History   Grav Para Term Preterm Abortions TAB SAB Ect Mult Living                 Review of Systems  Unable to perform ROS: Mental status change    Allergies  Codeine  Home Medications   Current Outpatient Rx  Name  Route  Sig  Dispense  Refill  . ALPRAZolam (XANAX) 1 MG tablet   Oral   Take 1 mg by mouth at bedtime.         Marland Kitchen amLODipine (NORVASC) 10 MG tablet   Oral   Take 10 mg by mouth daily.         . calcitRIOL (ROCALTROL) 0.25 MCG capsule   Oral   Take 0.25 mcg by mouth every other day.         . calcium carbonate (OS-CAL) 600 MG TABS  tablet   Oral   Take 600 mg by mouth 2 (two) times daily with a meal.         . diphenhydramine-acetaminophen (TYLENOL PM) 25-500 MG TABS   Oral   Take 1 tablet by mouth at bedtime as needed.         . furosemide (LASIX) 40 MG tablet   Oral   Take 80 mg by mouth.         . gabapentin (NEURONTIN) 100 MG capsule   Oral   Take 100 mg by mouth at bedtime.         . magnesium oxide (MAG-OX) 400 MG tablet   Oral   Take 400 mg by mouth daily.         . metoprolol (LOPRESSOR) 50 MG tablet   Oral   Take 75 mg by mouth 2 (two) times daily.         . Multiple Vitamin (MULTIVITAMIN) tablet   Oral   Take 1 tablet by mouth daily.         . sirolimus (RAPAMUNE) 1 MG tablet   Oral   Take 1 mg by mouth daily.         . tacrolimus (PROGRAF) 0.5 MG capsule   Oral   Take  0.5 mg by mouth 2 (two) times daily.         . tacrolimus (PROGRAF) 1 MG capsule   Oral   Take 1 mg by mouth 2 (two) times daily.          BP 168/55  Pulse 65  Temp(Src) 96.5 F (35.8 C)  Resp 22  Wt 175 lb (79.379 kg)  SpO2 99% Physical Exam  Constitutional: She appears well-developed and well-nourished.  Somnolent but arousable to voice  HENT:  Head: Normocephalic.  Periorbital bruising on the left, no malocclusion, no battle sign, mucous membranes appear dry  Eyes:  Left orbit with prosthetic eye, right eye with normal pupil, clear conjunctiva  Neck:  Immobilized in cervical collar  Cardiovascular:  Regular rate and rhythm, normal pulses at the radial artery, no murmurs. Fistula in the left upper extremity. with good thrill  Pulmonary/Chest:  Rales in the left lung, slight shallow breathing, no tachypnea, oxygen saturations between 85 and 95% dependent on oxygen supplementation and effort  Abdominal: Soft.  Bruising to the left periumbilical and left lower quadrant, no guarding, no peritoneal signs, no hepatosplenomegaly  Musculoskeletal: She exhibits edema (edema to the right lower extremity which is pitting and 2+). She exhibits no tenderness.  Neurological:  Somnolent, arousable, follows commands, moves all 4 extremities, normal grips, able to straight leg raise bilaterally several inches off the bed, appears generally weak  Skin: Skin is warm and dry. No rash noted. No erythema.    ED Course  Procedures (including critical care time) Labs Review Labs Reviewed  GLUCOSE, CAPILLARY - Abnormal; Notable for the following:    Glucose-Capillary 189 (*)    All other components within normal limits  CBC WITH DIFFERENTIAL - Abnormal; Notable for the following:    RDW 16.2 (*)    All other components within normal limits  COMPREHENSIVE METABOLIC PANEL - Abnormal; Notable for the following:    Glucose, Bld 217 (*)    BUN 30 (*)    Creatinine, Ser 1.24 (*)    Calcium  10.7 (*)    GFR calc non Af Amer 40 (*)    GFR calc Af Amer 46 (*)    All other components  within normal limits  URINALYSIS, ROUTINE W REFLEX MICROSCOPIC - Abnormal; Notable for the following:    APPearance HAZY (*)    Hgb urine dipstick SMALL (*)    Protein, ur 100 (*)    Leukocytes, UA SMALL (*)    All other components within normal limits  PRO B NATRIURETIC PEPTIDE - Abnormal; Notable for the following:    Pro B Natriuretic peptide (BNP) 13226.0 (*)    All other components within normal limits  URINE MICROSCOPIC-ADD ON - Abnormal; Notable for the following:    Bacteria, UA MANY (*)    All other components within normal limits  CULTURE, BLOOD (ROUTINE X 2)  CULTURE, BLOOD (ROUTINE X 2)  URINE CULTURE  URINE CULTURE  TROPONIN I  MAGNESIUM  PHOSPHORUS  APTT  PROTIME-INR  LACTIC ACID, PLASMA   Imaging Review Ct Abdomen Pelvis Wo Contrast  01/15/2013   CLINICAL DATA:  Fall.  Altered mental status.  EXAM: CT ABDOMEN AND PELVIS WITHOUT CONTRAST  TECHNIQUE: Multidetector CT imaging of the abdomen and pelvis was performed following the standard protocol without intravenous contrast.  COMPARISON:  None.  FINDINGS: The patient has large bilateral pleural effusions. There is cardiomegaly. No pericardial effusion. Compressive atelectasis in lung bases is noted.  The right kidney is markedly atrophic with multiple cysts identified. There is a cystic structure in the left upper quadrant with dense rim calcification which may represent the patient's left kidney or adrenal gland. The left adrenal gland is not definitely visualized. There is a right lower quadrant renal transplant with 2-3 small lobules of gas the renal collecting system likely related to Foley catheterization. The patient is status post cholecystectomy. The liver, spleen and pancreas are unremarkable. Extensive atherosclerotic vascular disease is seen. The patient is status post hysterectomy. There is a trace amount of free pelvic  fluid. The stomach and small and large bowel appear normal. Diffuse body wall edema is noted. The patient is status post hysterectomy. No lymphadenopathy is seen. No fracture is seen. No lytic or sclerotic bony lesion is seen with multilevel lumbar spondylosis noted.  IMPRESSION: Large appearing bilateral pleural effusions with associated compressive basilar atelectasis.  Cardiomegaly.  Trace amount of free pelvic fluid is nonspecific but could be due to IV fluid resuscitation or anasarca with diffuse body wall edema noted.  Tiny amount of air in the right lower quadrant renal transplant is likely related to Foley catheterization.   Electronically Signed   By: Inge Rise M.D.   On: 01/15/2013 05:52   Ct Head Wo Contrast  01/15/2013   CLINICAL DATA:  Altered mental status.  Status post fall.  EXAM: CT HEAD WITHOUT CONTRAST  CT CERVICAL SPINE WITHOUT CONTRAST  TECHNIQUE: Multidetector CT imaging of the head and cervical spine was performed following the standard protocol without intravenous contrast. Multiplanar CT image reconstructions of the cervical spine were also generated.  COMPARISON:  Cervical spine CT scan 02/28/2003. Brain MRI 07/23/2005.  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 Chronic microvascular ischemic change is seen. No evidence of acute abnormality including infarction, hemorrhage, mass lesion, mass effect, midline shift or abnormal extra-axial fluid collection. Mucosal thickening bilateral maxillary sinuses and is seen. There is some scattered ethmoid air cell disease. Prosthetic left globe is noted. The calvarium is intact.  CT CERVICAL SPINE FINDINGS  No fracture is identified. Mild anterolisthesis C4 on C7 and C7 on T1 due to facet arthropathy is noted. Anterior endplate spurring is seen. The patient has  large appearing bilateral pleural effusions. Interlobular septal thickening in the apices is noted.  IMPRESSION: No acute finding head or  cervical spine.  Chronic microvascular ischemic change and remote right parieto-occipital infarct.  Large appearing bilateral pleural effusions and interlobular septal thickening compatible with pulmonary edema.   Electronically Signed   By: Inge Rise M.D.   On: 01/15/2013 05:46   Ct Cervical Spine Wo Contrast  01/15/2013   CLINICAL DATA:  Altered mental status.  Status post fall.  EXAM: CT HEAD WITHOUT CONTRAST  CT CERVICAL SPINE WITHOUT CONTRAST  TECHNIQUE: Multidetector CT imaging of the head and cervical spine was performed following the standard protocol without intravenous contrast. Multiplanar CT image reconstructions of the cervical spine were also generated.  COMPARISON:  Cervical spine CT scan 02/28/2003. Brain MRI 07/23/2005.  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 Chronic microvascular ischemic change is seen. No evidence of acute abnormality including infarction, hemorrhage, mass lesion, mass effect, midline shift or abnormal extra-axial fluid collection. Mucosal thickening bilateral maxillary sinuses and is seen. There is some scattered ethmoid air cell disease. Prosthetic left globe is noted. The calvarium is intact.  CT CERVICAL SPINE FINDINGS  No fracture is identified. Mild anterolisthesis C4 on C7 and C7 on T1 due to facet arthropathy is noted. Anterior endplate spurring is seen. The patient has large appearing bilateral pleural effusions. Interlobular septal thickening in the apices is noted.  IMPRESSION: No acute finding head or cervical spine.  Chronic microvascular ischemic change and remote right parieto-occipital infarct.  Large appearing bilateral pleural effusions and interlobular septal thickening compatible with pulmonary edema.   Electronically Signed   By: Inge Rise M.D.   On: 01/15/2013 05:46   Dg Chest Port 1 View  01/15/2013   CLINICAL DATA:  Altered mental status.  Fall, weakness.  EXAM: PORTABLE CHEST - 1 VIEW   COMPARISON:  Plain film of the chest 05/06/2007.  FINDINGS: There is extensive bilateral airspace disease. Pleural effusions are identified and appear greater on the right. Cardiomegaly is seen.  IMPRESSION: Appears the chest most compatible with congestive heart failure with associated pleural effusions and basilar atelectasis.   Electronically Signed   By: Inge Rise M.D.   On: 01/15/2013 05:05    EKG Interpretation    Date/Time:  Saturday January 15 2013 05:42:15 EST Ventricular Rate:  65 PR Interval:  156 QRS Duration: 96 QT Interval:  464 QTC Calculation: 482 R Axis:   18 Text Interpretation:  Normal sinus rhythm Normal ECG When compared with ECG of 15-Jan-2013 04:33, Premature atrial complexes are no longer Present Confirmed by Nkosi Cortright  MD, Yolinda Duerr ([id number]) on 01/15/2013 6:15:20 AM            MDM   1. Encephalopathy   2. UTI (lower urinary tract infection)   3. CHF (congestive heart failure)   4. Pleural effusion, bilateral    The patient has a generalized encephalopathy as well as generalized fatigue, vital signs are remarkable only for her oxygen saturation which is low. We'll obtain a rectal temperature, and out catheterization for urine, laboratory workup, cardiac workup and imaging of the head cervical spine and the chest considering pneumonia and traumatic injury as possible sources for her altered mental status.  Pt has multiple abnormalities on w/u  1.  Pleural Effusions 2.  Pulmonary Edema - likely CHF 3.  UTI - Broad spectrum Abx given, blood cx and lactic acid pending.  Renal function preserved and blood counts normal  I have d/w Dr. Sloan Leiter re: disposition - agreeable to admission to Step down.  Family informed.  Meds given in ED:  Medications  vancomycin (VANCOCIN) IVPB 1000 mg/200 mL premix (not administered)  piperacillin-tazobactam (ZOSYN) IVPB 3.375 g (3.375 g Intravenous New Bag/Given 01/15/13 0617)  0.9 %  sodium chloride infusion ( Intravenous  New Bag/Given 01/15/13 0535)    New Prescriptions   No medications on file      Johnna Acosta, MD 01/15/13 [phone]

## 2013-01-15 NOTE — Progress Notes (Signed)
Dr Sarajane Jews paged. Pt sitting up in bed, alert and talking to family. Pt states she is hungry. Pt currently NPO. Md paged. Diet orders received. Pt and family updated. Will continue to monitor.

## 2013-01-15 NOTE — ED Notes (Addendum)
Dr. Goodrich at bedside,

## 2013-01-15 NOTE — ED Notes (Signed)
Fell at home when getting out of the bed. Family states that she is weak and has been this way for the past several days.

## 2013-01-16 ENCOUNTER — Inpatient Hospital Stay (HOSPITAL_COMMUNITY): Payer: Medicare PPO

## 2013-01-16 DIAGNOSIS — J90 Pleural effusion, not elsewhere classified: Secondary | ICD-10-CM

## 2013-01-16 DIAGNOSIS — I517 Cardiomegaly: Secondary | ICD-10-CM

## 2013-01-16 LAB — COMPREHENSIVE METABOLIC PANEL
ALK PHOS: 64 U/L (ref 39–117)
ALT: 8 U/L (ref 0–35)
AST: 15 U/L (ref 0–37)
Albumin: 3.1 g/dL — ABNORMAL LOW (ref 3.5–5.2)
BILIRUBIN TOTAL: 0.6 mg/dL (ref 0.3–1.2)
BUN: 24 mg/dL — AB (ref 6–23)
CO2: 28 mEq/L (ref 19–32)
Calcium: 9.5 mg/dL (ref 8.4–10.5)
Chloride: 102 mEq/L (ref 96–112)
Creatinine, Ser: 1.27 mg/dL — ABNORMAL HIGH (ref 0.50–1.10)
GFR calc non Af Amer: 39 mL/min — ABNORMAL LOW (ref >90)
GFR, EST AFRICAN AMERICAN: 45 mL/min — AB (ref >90)
GLUCOSE: 180 mg/dL — AB (ref 70–99)
Potassium: 3.8 mEq/L (ref 3.7–5.3)
Sodium: 144 mEq/L (ref 137–147)
TOTAL PROTEIN: 6.6 g/dL (ref 6.0–8.3)

## 2013-01-16 LAB — TROPONIN I: Troponin I: <0.3 ng/mL (ref <0.30)

## 2013-01-16 LAB — CBC
HCT: 36.9 % (ref 36.0–46.0)
Hemoglobin: 11.7 g/dL — ABNORMAL LOW (ref 12.0–15.0)
MCH: 27.5 pg (ref 26.0–34.0)
MCHC: 31.7 g/dL (ref 30.0–36.0)
MCV: 86.6 fL (ref 78.0–100.0)
Platelets: 145 10*3/uL — ABNORMAL LOW (ref 150–400)
RBC: 4.26 MIL/uL (ref 3.87–5.11)
RDW: 16.2 % — ABNORMAL HIGH (ref 11.5–15.5)
WBC: 3.2 10*3/uL — AB (ref 4.0–10.5)

## 2013-01-16 MED ORDER — FUROSEMIDE 40 MG PO TABS: 40.0000 mg | ORAL_TABLET | Freq: Every day | ORAL | Status: DC

## 2013-01-16 MED ORDER — FUROSEMIDE 10 MG/ML IJ SOLN
40.0000 mg | Freq: Two times a day (BID) | INTRAMUSCULAR | Status: AC
  Administered 2013-01-16: 40 mg via INTRAVENOUS
  Filled 2013-01-16: qty 4

## 2013-01-16 MED ORDER — GABAPENTIN 100 MG PO CAPS
100.0000 mg | ORAL_CAPSULE | Freq: Every day | ORAL | Status: DC
  Administered 2013-01-16 – 2013-01-19 (×4): 100 mg via ORAL
  Filled 2013-01-16 (×4): qty 1

## 2013-01-16 MED ORDER — METOPROLOL TARTRATE 50 MG PO TABS
75.0000 mg | ORAL_TABLET | Freq: Two times a day (BID) | ORAL | Status: DC
  Administered 2013-01-16 – 2013-01-19 (×8): 75 mg via ORAL
  Filled 2013-01-16 (×16): qty 1

## 2013-01-16 MED ORDER — FUROSEMIDE 80 MG PO TABS
80.0000 mg | ORAL_TABLET | Freq: Every day | ORAL | Status: DC
  Administered 2013-01-17 – 2013-01-20 (×4): 80 mg via ORAL
  Filled 2013-01-16: qty 1
  Filled 2013-01-16 (×3): qty 2

## 2013-01-16 MED ORDER — AMLODIPINE BESYLATE 5 MG PO TABS
10.0000 mg | ORAL_TABLET | Freq: Every day | ORAL | Status: DC
  Administered 2013-01-16 – 2013-01-20 (×5): 10 mg via ORAL
  Filled 2013-01-16 (×5): qty 2

## 2013-01-16 NOTE — Plan of Care (Signed)
Problem: Consults Goal: Heart Failure Patient Education (See Patient Education module for education specifics.)  Outcome: Progressing Discussed with need about the heart failure video available to all family members.  Handouts given and will discuss in am.  Patient and niece resting tonight. Goal: Nutrition Consult-if indicated Outcome: Progressing Patient has history of FTT  Problem: Phase I Progression Outcomes Goal: Hemodynamically stable Outcome: Progressing Vitals remain stable for this patient.

## 2013-01-16 NOTE — Progress Notes (Signed)
[facility] PROGRESS NOTE  [name] GEX:[id number] DOB: Jun 23, 1930 DOA: 01/15/2013 PCP: [name]., MD  Summary: 80 year old woman with history of end-stage renal disease, status post kidney transplant 2008 on antirejection therapy presented to the hospital with multiple complaints including falls, confusion for the last several months, failure to thrive, increasing lower extremity edema, shortness of breath. Initial impression sepsis, UTI, acute congestive heart failure, subacute encephalopathy.  Assessment/Plan: 1. UTI with sepsis with tachypnea and hypothermia on admission. Remarkably improved. 2. Acute hypoxic respiratory failure secondary to sepsis and suspected CHF. Improving rapidly.  3. Acute congestive heart failure, type unknown. 4. Bilateral pleural effusions left greater than right. 5. Possible pneumonia. 6. Recurrent falls at home. 7. Failure to thrive at home. 8. Subacute encephalopathy for several months superimposed on chronic dementia. 9. History of lower extremity DVT. Doppler this admission showed chronic left lower extremity DVT. No treatment indicated. 10. Status post renal transplant maintained on Prograf and Rapamune.   Continue empiric antibiotics, followup urine and blood cultures.  Continue supplemental oxygen. Back off on diuresis. Change to oral therapy 1/12. Followup 2-D echocardiogram.   Although x-ray appearance is worse, clinically the patient is much improved with decreased respiratory effort. For now continue diuresis. Repeat chest x-ray in the morning, consider thoracentesis tomorrow.  Possible pneumonia on x-ray. Given the patient's clinical improvement, no change in antibiotics.  Overall remarkably improved. Plan transfer to medical floor with telemetry.  Pending studies:   BC  UC  Code Status: DNR, DNI DVT prophylaxis: heparin Family Communication: Discussed above with husband, son, daughter at bedside. Disposition Plan:  Pending  Murray Hodgkins, MD  [facility]  Pager [phone] If 7PM-7AM, please contact night-coverage at [email or web address], password [facility] 01/16/2013, 7:28 AM  LOS: 1 day   Consultants:    Procedures:    Antibiotics:  Zosyn 1/10 >>  HPI/Subjective: Ultrasound revealed chronic DVT, consistent with history of DVT. No acute thrombus. Improved yesterday afternoon and was placed on diet. Feels much better. Breathing better. Swelling has gone down. No pain.  Objective: Filed Vitals:   01/16/13 0200 01/16/13 0300 01/16/13 0400 01/16/13 0500  BP: 150/41 162/54 158/51 165/52  Pulse: 65 70 66 72  Temp:   99.2 F (37.3 C)   TempSrc:   Axillary   Resp: 20 22 24 24   Height:      Weight:    73.2 kg (161 lb 6 oz)  SpO2: 98% 94% 91% 95%    Intake/Output Summary (Last 24 hours) at 01/16/13 0728 Last data filed at 01/16/13 3664  Gross per 24 hour  Intake    250 ml  Output   4000 ml  Net  -3750 ml     Filed Weights   01/15/13 0424 01/15/13 0945 01/16/13 0500  Weight: 79.379 kg (175 lb) 74.2 kg (163 lb 9.3 oz) 73.2 kg (161 lb 6 oz)    Exam:   Euthermic. Respiratory rate 20s. Hypertensive. Minimal hypoxia, 2 L.  General: Appears remarkably improved. Nontoxic.  Psychiatric: Grossly normal mood and affect. Speech fluent and appropriate. Oriented to location, month, year, family members.  Cardiovascular: Regular rate and rhythm. No murmur, rub or gallop. No resolution of bilateral lower extremity edema.  Respiratory: Improved air movement especially in the left. No rhonchi, wheezes or rales. Mild increased respiratory effort.  Abdomen soft, nontender, nondistended.  Skin some abrasions on the toes, no evidence of cellulitis.  Data Reviewed:  Weights may be inaccurate, appears to be down at least 1 kg.  -  3.7 L since admission. Excellent urine output (-4 L in 24 hours).  Bilateral lower extremity venous Dopplers revealed chronic thrombus left thigh.  Troponin is  negative.  BUN, creatinine stable  Hemoglobin slightly decreased, lymphoid 7. Platelet count slightly down, 145.  Blood cultures and urine culture pending  Repeat chest x-ray pending  Scheduled Meds: . antiseptic oral rinse  15 mL Mouth Rinse q12n4p  . chlorhexidine  15 mL Mouth Rinse BID  . furosemide  40 mg Intravenous Q6H  . heparin  5,000 Units Subcutaneous Q8H  . piperacillin-tazobactam (ZOSYN)  IV  3.375 g Intravenous Q8H  . sirolimus  1 mg Oral Daily  . sodium chloride  3 mL Intravenous Q12H  . sodium chloride  3 mL Intravenous Q12H  . tacrolimus  0.5 mg Oral BID  . tacrolimus  1 mg Oral BID   Continuous Infusions:   Principal Problem:   Sepsis secondary to UTI Active Problems:   CHF (congestive heart failure)   Acute respiratory failure with hypoxia   Encephalopathy   Sepsis   Time spent 20 minutes

## 2013-01-17 ENCOUNTER — Inpatient Hospital Stay (HOSPITAL_COMMUNITY): Payer: Medicare PPO

## 2013-01-17 DIAGNOSIS — J189 Pneumonia, unspecified organism: Secondary | ICD-10-CM

## 2013-01-17 DIAGNOSIS — I5031 Acute diastolic (congestive) heart failure: Secondary | ICD-10-CM

## 2013-01-17 LAB — BASIC METABOLIC PANEL
BUN: 19 mg/dL (ref 6–23)
CHLORIDE: 103 meq/L (ref 96–112)
CO2: 29 mEq/L (ref 19–32)
Calcium: 8.9 mg/dL (ref 8.4–10.5)
Creatinine, Ser: 1.2 mg/dL — ABNORMAL HIGH (ref 0.50–1.10)
GFR calc Af Amer: 48 mL/min — ABNORMAL LOW (ref >90)
GFR calc non Af Amer: 42 mL/min — ABNORMAL LOW (ref >90)
GLUCOSE: 224 mg/dL — AB (ref 70–99)
POTASSIUM: 3.6 meq/L — AB (ref 3.7–5.3)
Sodium: 144 mEq/L (ref 137–147)

## 2013-01-17 LAB — CBC
HEMATOCRIT: 37.5 % (ref 36.0–46.0)
HEMOGLOBIN: 12.1 g/dL (ref 12.0–15.0)
MCH: 27.7 pg (ref 26.0–34.0)
MCHC: 32.3 g/dL (ref 30.0–36.0)
MCV: 85.8 fL (ref 78.0–100.0)
Platelets: ADEQUATE 10*3/uL (ref 150–400)
RBC: 4.37 MIL/uL (ref 3.87–5.11)
RDW: 15.8 % — ABNORMAL HIGH (ref 11.5–15.5)
WBC: 3.6 10*3/uL — ABNORMAL LOW (ref 4.0–10.5)

## 2013-01-17 MED ORDER — ENSURE COMPLETE PO LIQD
237.0000 mL | Freq: Three times a day (TID) | ORAL | Status: DC
  Administered 2013-01-18 – 2013-01-20 (×7): 237 mL via ORAL

## 2013-01-17 MED ORDER — POTASSIUM CHLORIDE CRYS ER 20 MEQ PO TBCR
40.0000 meq | EXTENDED_RELEASE_TABLET | Freq: Once | ORAL | Status: AC
  Administered 2013-01-17: 40 meq via ORAL
  Filled 2013-01-17: qty 2

## 2013-01-17 NOTE — Progress Notes (Signed)
Inpatient Diabetes Program Recommendations  AACE/ADA: New Consensus Statement on Inpatient Glycemic Control  Target Ranges:  Prepandial:   less than 140 mg/dL      Peak postprandial:   less than 180 mg/dL (1-2 hours)      Critically ill patients:  140 - 180 mg/dL  Pager:  [phone] Hours:  8 am-10pm   Reason for Visit: Elevated glucose:  224 mg/dL  Inpatient Diabetes Program Recommendations Correction (SSI): Add Novolog Correction HgbA1C: Check HgbA1C to assess glycemic control  Courtney Heys PhD, RN, BC-ADM Diabetes Coordinator  Office:  [phone] Team Pager:  [phone]

## 2013-01-17 NOTE — Care Management Note (Signed)
    Page 1 of 1   01/17/2013     2:44:25 PM   CARE MANAGEMENT NOTE 01/17/2013  Patient:  COLLYNS, MCQUIGG   Account Number:  [id number]  Date Initiated:  01/17/2013  Documentation initiated by:  Theophilus Kinds  Subjective/Objective Assessment:   Pt admitted from home with UTI/sepsis. Pt lives with her husband and will return home at discharge. Pt has been fairly independent with ADL's. Pt has a cane and walker for home use.     Action/Plan:   Pt would benefit from [facility] RN and PT at discharge. Pt would like to discuss with her husband and daughter. CM will followup at discharge for choice of agency for [facility].   Anticipated DC Date:  01/20/2013   Anticipated DC Plan:  [place]  CM consult      Choice offered to / List presented to:             Status of service:  Completed, signed off Medicare Important Message given?   (If response is "NO", the following Medicare IM given date fields will be blank) Date Medicare IM given:   Date Additional Medicare IM given:    Discharge Disposition:  [place]  Per UR Regulation:    If discussed at Long Length of Stay Meetings, dates discussed:    Comments:  01/17/13 [place], RN  BSN CM

## 2013-01-17 NOTE — Evaluation (Signed)
Physical Therapy Evaluation Patient Details Name: [name] MRN: [id number] DOB: 05/13/1930 Today's Date: 01/17/2013 Time: 6789-3810 PT Time Calculation (min): 54 min  PT Assessment / Plan / Recommendation History of Present Illness  Pt with a complicated medical hx is admitted with acute on chronic CHF and sepsis due to a UTI.  She experienced dyspnea and confusion as well as multiple falls at home.  Daughter stated that pt had Shingles in October 2014 and developed severe weakness at that time and has never really recovered.  Pt is s/p kidney transplant in 2007.  Pt lives with her husband and has recently been requiring  the use of a walker for gait.  Prior to that she used a cane.  Clinical Impression   Pt was seen for evaluation.  She is very pleasant and cooperative but severely deconditioned.  She was able to come off of supplemental O2 maintaining 94% O2 sat.  She was only able to ambulate 78' with a walker with unstable gait.  Pt c/o exhaustion with any activity.  I am recommending SNF at d/c as she continues to be a very high fall risk.   PT Assessment  Patient needs continued PT services    Follow Up Recommendations  SNF    Does the patient have the potential to tolerate intense rehabilitation      Barriers to Discharge Decreased caregiver support husband is experiencing CG fatigue    Equipment Recommendations  None recommended by PT    Recommendations for Other Services     Frequency Min 3X/week    Precautions / Restrictions Precautions Precautions: Fall Restrictions Weight Bearing Restrictions: No   Pertinent Vitals/Pain       Mobility  Bed Mobility Overal bed mobility: Needs Assistance Bed Mobility: Supine to Sit Supine to sit: Mod assist General bed mobility comments: has difficulty moving her body weight in the bed Transfers Overall transfer level: Needs assistance Equipment used: Rolling walker (2 wheeled) Transfers: Sit to/from Stand Sit to Stand:  Mod assist Ambulation/Gait Ambulation/Gait assistance: Mod assist Ambulation Distance (Feet): 15 Feet Assistive device: Rolling walker (2 wheeled) Gait Pattern/deviations: Trunk flexed Gait velocity interpretation: Below normal speed for age/gender General Gait Details: tends to lose her balance backward    Exercises     PT Diagnosis: Difficulty walking;Generalized weakness  PT Problem List: Decreased strength;Decreased activity tolerance;Decreased balance;Decreased mobility;Decreased knowledge of use of DME;Decreased safety awareness;Cardiopulmonary status limiting activity;Decreased skin integrity PT Treatment Interventions: Gait training;Functional mobility training;Therapeutic exercise     PT Goals(Current goals can be found in the care plan section) Acute Rehab PT Goals Patient Stated Goal: wants to be stronger and to go home PT Goal Formulation: With patient/family Time For Goal Achievement: 01/31/13 Potential to Achieve Goals: Good  Visit Information  Last PT Received On: 01/17/13 History of Present Illness: Pt with a complicated medical hx is admitted with acute on chronic CHF and sepsis due to a UTI.  She experienced dyspnea and confusion as well as multiple falls at home.  Daughter stated that pt had Shingles in October 2014 and developed severe weakness at that time and has never really recovered.  Pt is s/p kidney transplant in 2007.  Pt lives with her husband and has recently been requiring  the use of a walker for gait.  Prior to that she used a cane.       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 Communication Communication: HOH    Cognition   Cognition Arousal/Alertness: Awake/alert Behavior During Therapy: WFL for tasks assessed/performed Overall Cognitive Status: Within Functional Limits for tasks assessed    Extremity/Trunk Assessment Lower Extremity Assessment Lower Extremity Assessment: Generalized weakness   Balance Balance Overall balance assessment: History of Falls  End of Session PT - End of Session Equipment Utilized During Treatment: Gait belt Activity Tolerance: Patient limited by fatigue Patient left: in chair;with call bell/phone within reach;with chair alarm set Nurse Communication: Mobility status  GP     Sable Feil 01/17/2013, 4:57 PM

## 2013-01-17 NOTE — Progress Notes (Signed)
UR chart review completed.  

## 2013-01-17 NOTE — Progress Notes (Signed)
INITIAL NUTRITION ASSESSMENT  DOCUMENTATION CODES Per approved criteria  -Not Applicable   INTERVENTION: Ensure Complete po BID, each supplement provides 350 kcal and 13 grams of protein  NUTRITION DIAGNOSIS: Inadequate oral intake related to decreased appetite as evidenced by PO: 50%.   Goal: Pt will meet >90% of estimated energy needs  Monitor:  PO intake, weight changes, labs, skin assessments  Reason for Assessment: MST=2  80 y.o. female  Admitting Dx: Sepsis secondary to UTI  ASSESSMENT: Pt admitted for fall and encephalopathy. S/p kidney transplant in 2008. Pt with excellent family support.  Attempted to examine pt multiple times today, however, pt with multiple members of the healthcare team at times of each visit.  Wt hx reveals UBW of 180#. No weight hx available between today and 2011. Noted poor appetite PTA. PO: 50%.  Unable to diagnose malnutrition at this time, however, pt is at risk for malnutrition due to decreased appetite, advanced age, and multiple medical issues.   Height: Ht Readings from Last 1 Encounters:  01/15/13 5\' 8"  (1.727 m)    Weight: Wt Readings from Last 1 Encounters:  01/17/13 156 lb 12 oz (71.1 kg)    Ideal Body Weight: 140#  % Ideal Body Weight: 111%  Wt Readings from Last 10 Encounters:  01/17/13 156 lb 12 oz (71.1 kg)  06/18/09 175 lb (79.379 kg)  03/12/09 180 lb (81.647 kg)  02/13/09 187 lb (84.823 kg)    Usual Body Weight: 180#  % Usual Body Weight: 87%  BMI:  Body mass index is 23.84 kg/(m^2). Meets criteria for normal weight.   Estimated Nutritional Needs: Kcal: 1200-1300 daily Protein: 57-71 grams daily Fluid: 1.2-1.3 L daily  Skin: abrasion on rt anterior foot  Diet Order: General  EDUCATION NEEDS: -Education not appropriate at this time   Intake/Output Summary (Last 24 hours) at 01/17/13 1554 Last data filed at 01/17/13 1300  Gross per 24 hour  Intake    580 ml  Output   1900 ml  Net  -1320 ml     Last BM: 01/16/13  Labs:   Recent Labs Lab 01/15/13 0452 01/16/13 0432 01/17/13 0846  NA 140 144 144  K 4.9 3.8 3.6*  CL 100 102 103  CO2 28 28 29   BUN 30* 24* 19  CREATININE 1.24* 1.27* 1.20*  CALCIUM 10.7* 9.5 8.9  MG 2.0  --   --   PHOS 4.0  --   --   GLUCOSE 217* 180* 224*    CBG (last 3)   Recent Labs  01/15/13 0425  GLUCAP 189*    Scheduled Meds: . amLODipine  10 mg Oral Daily  . furosemide  80 mg Oral Daily  . gabapentin  100 mg Oral QHS  . heparin  5,000 Units Subcutaneous Q8H  . metoprolol  75 mg Oral BID  . piperacillin-tazobactam (ZOSYN)  IV  3.375 g Intravenous Q8H  . sirolimus  1 mg Oral Daily  . sodium chloride  3 mL Intravenous Q12H  . sodium chloride  3 mL Intravenous Q12H  . tacrolimus  0.5 mg Oral BID  . tacrolimus  1 mg Oral BID    Continuous Infusions:   Past Medical History  Diagnosis Date  . ESRD (end stage renal disease)     etiology unknown, history of dialysis  . Shingles     October 2014  . Diabetes mellitus without complication     Past Surgical History  Procedure Laterality Date  . Kidney transplant  about 2008  . Nephrectomy transplanted organ    . Dialysis fistula creation Left   . Prostetic eye Left     cornea damage  . Left arm fracture    . Cholecystectomy    . Abdominal hysterectomy     Aasim Restivo A. Jimmye Norman, RD, LDN Pager: [phone]

## 2013-01-17 NOTE — Progress Notes (Signed)
[facility] PROGRESS NOTE  [name] VPX:[id number] DOB: January 12, 1930 DOA: 01/15/2013 PCP: [name]., MD  Summary: 80 year old woman with history of end-stage renal disease, status post kidney transplant 2008 on antirejection therapy presented to the hospital with multiple complaints including falls, confusion for the last several months, failure to thrive, increasing lower extremity edema, shortness of breath. Initial impression sepsis, UTI, acute congestive heart failure, subacute encephalopathy. She is rapidly improved with IV diuresis, empiric treatment for possible pneumonia and UTI with sepsis. Plan transferred to the floor at home in 48 hours.  Assessment/Plan: 1. UTI with sepsis with tachypnea and hypothermia on admission. Continues to improve rapidly. 2. Acute hypoxic respiratory failure secondary to sepsis and acute diastolic heart failure. Improving rapidly. Wean oxygen as tolerated. 3. Acute diastolic congestive heart failure. Much improved. Appears euvolemic at this time. 4. Bilateral pleural effusions left greater than right. 5. Possible pneumonia. Appears to be rapidly improving. 6. Recurrent falls at home. Physical therapy consultation. 7. Failure to thrive at home. Eating well now. 8. Subacute encephalopathy for several months superimposed on chronic dementia. Stable. 9. History of lower extremity DVT. Doppler this admission showed chronic left lower extremity DVT. No treatment indicated. 10. Status post renal transplant maintained on Prograf and Rapamune.   Wean oxygen  Continue oral Lasix.  Continue empiric therapy for pneumonia and UTI. Change to oral therapy in the morning.  Consult PT  Replace potassium  Transfer to floor  Likely able to discharge in the next 48 hours.  Pending studies:   BC--NGTD  UC--GNR  Code Status: DNR, DNI DVT prophylaxis: heparin Family Communication: Discussed above with husband, 2 sons, daughter at  bedside. Disposition Plan: Pending  Murray Hodgkins, MD  [facility]  Pager [phone] If 7PM-7AM, please contact night-coverage at [email or web address], password [facility] 01/17/2013, 10:32 AM  LOS: 2 days   Consultants:    Procedures:  2-D echocardiogram: Left ventricular ejection fraction 60-65%. Normal wall motion. Grade 2 diastolic dysfunction.  Antibiotics:  Zosyn 1/10 >>  HPI/Subjective: Continues to improve. Breathing better. Eating better. No pain.  Objective: Filed Vitals:   01/17/13 0300 01/17/13 0400 01/17/13 0500 01/17/13 0754  BP:  151/40    Pulse: 63 65 69   Temp:  97.7 F (36.5 C)  98 F (36.7 C)  TempSrc:  Axillary  Axillary  Resp: 22 23 25    Height:      Weight:   71.1 kg (156 lb 12 oz)   SpO2: 98% 97% 96%     Intake/Output Summary (Last 24 hours) at 01/17/13 1032 Last data filed at 01/17/13 0900  Gross per 24 hour  Intake    630 ml  Output   1150 ml  Net   -520 ml     Filed Weights   01/15/13 0945 01/16/13 0500 01/17/13 0500  Weight: 74.2 kg (163 lb 9.3 oz) 73.2 kg (161 lb 6 oz) 71.1 kg (156 lb 12 oz)    Exam:   Afebrile. Respiratory rate in the 20s. Hemodynamics stable. Minimal hypoxia.  General: Continues to improve in appearance each day. Calm, nontoxic.  Cardiovascular: Regular rate and rhythm. No murmur, rub or gallop. Lower extremity edema has resolved.  Telemetry sinus rhythm  Respiratory clear to auscultation bilaterally with improved air movement. No frank wheezes, rales or rhonchi. Normal respiratory effort.  Abdomen soft nontender nondistended  Skin appears grossly unremarkable on limited exam  Psychiatric grossly normal mood and affect. Speech fluent and appropriate.  Data Reviewed:  Weights may be  inaccurate, appears to be down at least 3 kg since admission.  -5.1 L since admission. Excellent urine output (-2 L in 24 hours).  BUN, creatinine stable. Potassium 3.6.  Hemoglobin stable.  Blood cultures and urine  culture pending  Repeat chest x-ray independently reviewed, CHF pattern has improved. Left pleural effusion has decreased. I concur with radiology interpretation.  Scheduled Meds: . amLODipine  10 mg Oral Daily  . furosemide  80 mg Oral Daily  . gabapentin  100 mg Oral QHS  . heparin  5,000 Units Subcutaneous Q8H  . metoprolol  75 mg Oral BID  . piperacillin-tazobactam (ZOSYN)  IV  3.375 g Intravenous Q8H  . sirolimus  1 mg Oral Daily  . sodium chloride  3 mL Intravenous Q12H  . sodium chloride  3 mL Intravenous Q12H  . tacrolimus  0.5 mg Oral BID  . tacrolimus  1 mg Oral BID   Continuous Infusions:   Principal Problem:   Sepsis secondary to UTI Active Problems:   CHF (congestive heart failure)   Acute respiratory failure with hypoxia   Encephalopathy   Sepsis   Acute diastolic heart failure   Pneumonia   Time spent 20 minutes

## 2013-01-18 LAB — BASIC METABOLIC PANEL
BUN: 20 mg/dL (ref 6–23)
CALCIUM: 9.1 mg/dL (ref 8.4–10.5)
CO2: 25 mEq/L (ref 19–32)
Chloride: 104 mEq/L (ref 96–112)
Creatinine, Ser: 1.23 mg/dL — ABNORMAL HIGH (ref 0.50–1.10)
GFR calc Af Amer: 47 mL/min — ABNORMAL LOW (ref >90)
GFR, EST NON AFRICAN AMERICAN: 40 mL/min — AB (ref >90)
GLUCOSE: 196 mg/dL — AB (ref 70–99)
Potassium: 3.8 mEq/L (ref 3.7–5.3)
Sodium: 143 mEq/L (ref 137–147)

## 2013-01-18 LAB — MAGNESIUM: Magnesium: 1.5 mg/dL (ref 1.5–2.5)

## 2013-01-18 LAB — GLUCOSE, CAPILLARY
Glucose-Capillary: 263 mg/dL — ABNORMAL HIGH (ref 70–99)
Glucose-Capillary: 216 mg/dL — ABNORMAL HIGH (ref 70–99)

## 2013-01-18 LAB — URINE CULTURE: Colony Count: >=100000

## 2013-01-18 MED ORDER — AMOXICILLIN-POT CLAVULANATE 875-125 MG PO TABS
1.0000 | ORAL_TABLET | Freq: Two times a day (BID) | ORAL | Status: DC
  Administered 2013-01-18 – 2013-01-20 (×5): 1 via ORAL
  Filled 2013-01-18 (×5): qty 1

## 2013-01-18 MED ORDER — INSULIN ASPART 100 UNIT/ML ~~LOC~~ SOLN
0.0000 [IU] | Freq: Three times a day (TID) | SUBCUTANEOUS | Status: DC
Start: 2013-01-18 — End: 2013-01-20
  Administered 2013-01-18: 5 [IU] via SUBCUTANEOUS
  Administered 2013-01-18: 3 [IU] via SUBCUTANEOUS
  Administered 2013-01-19 (×2): 2 [IU] via SUBCUTANEOUS
  Administered 2013-01-19: 13:00:00 via SUBCUTANEOUS
  Administered 2013-01-20: 2 [IU] via SUBCUTANEOUS

## 2013-01-18 MED ORDER — LOPERAMIDE HCL 2 MG PO CAPS
2.0000 mg | ORAL_CAPSULE | ORAL | Status: DC | PRN
  Administered 2013-01-18 (×2): 2 mg via ORAL
  Filled 2013-01-18 (×2): qty 1

## 2013-01-18 NOTE — Progress Notes (Signed)
[facility] PROGRESS NOTE  [name] CBJ:[id number] DOB: [birth year] DOA: 01/15/2013 PCP: [name]., MD  Summary: 80 year old woman with history of end-stage renal disease, status post kidney transplant 2008 on antirejection therapy presented to the hospital with multiple complaints including falls, confusion for the last several months, failure to thrive, increasing lower extremity edema, shortness of breath. Initial impression sepsis, UTI, acute congestive heart failure, subacute encephalopathy. She has rapidly improved with IV diuresis, empiric treatment for possible pneumonia and UTI with sepsis. Plan transfer to skilled nursing facility in the next 48 hours..  Assessment/Plan: 1. UTI K. pneumoniae with sepsis with tachypnea and hypothermia on admission. Pansensitive. 2. Acute hypoxic respiratory failure secondary to sepsis and acute diastolic heart failure. Appears resolved. Hypoxia resolved. 3. Acute diastolic congestive heart failure. Acute component resolved. Appears euvolemic at this time. 4. Bilateral pleural effusions left greater than right rapidly improved with IV diuresis, asymptomatic. 5. DM. Appears stable. 6. Possible pneumonia. Appears to be resolved. Hypoxia has resolved.. 7. Recurrent falls at home. Physical therapy consultation appreciated, skilled nursing facility recommended. 8. Failure to thrive at home. Eating well now. 9. Subacute encephalopathy for several months superimposed on chronic dementia. Stable. 10. History of lower extremity DVT. Doppler this admission showed chronic left lower extremity DVT. No treatment indicated. 11. Status post renal transplant maintained on Prograf and Rapamune.   Change to oral antibiotics, continue to treat UTI  SSI, hold oral anti-hyperglycemics while in hospital.   Anticipate transfer to skilled nursing facility when bed available  Pending studies:   BC--NGTD  Code Status: DNR, DNI DVT prophylaxis:  heparin Family Communication: Discussed above with husband, 2 sons, daughter at bedside. Disposition Plan: Pending  Murray Hodgkins, MD  [facility]  Pager [phone] If 7PM-7AM, please contact night-coverage at [email or web address], password [facility] 01/18/2013, 10:25 AM  LOS: 3 days   Consultants:  PT: SNF  Procedures:  2-D echocardiogram: Left ventricular ejection fraction 60-65%. Normal wall motion. Grade 2 diastolic dysfunction.  Antibiotics:  Zosyn 1/10 >> 1/13  Augmentin 1/13 >> 1/19  HPI/Subjective: Continues to feel better, ate better, breathing better.  Objective: Filed Vitals:   01/17/13 1037 01/17/13 1100 01/17/13 2128 01/18/13 0500  BP: 153/55  166/66   Pulse: 77 78 83   Temp:   98.5 F (36.9 C)   TempSrc:   Oral   Resp:  11 20   Height:      Weight:    67.314 kg (148 lb 6.4 oz)  SpO2:  97% 95%     Intake/Output Summary (Last 24 hours) at 01/18/13 1025 Last data filed at 01/18/13 0557  Gross per 24 hour  Intake    600 ml  Output    750 ml  Net   -150 ml     Filed Weights   01/16/13 0500 01/17/13 0500 01/18/13 0500  Weight: 73.2 kg (161 lb 6 oz) 71.1 kg (156 lb 12 oz) 67.314 kg (148 lb 6.4 oz)    Exam:   Afebrile. Respiratory rate in the 20s. Hemodynamics stable. Minimal hypoxia.  General: Appears calm and comfortable.  Cardiovascular: Regular rate and rhythm. No murmur, rub or gallop. No lower extremity edema.  Respiratory clear to auscultation bilaterally with improved air movement on the left. No frank wheezes, rales or rhonchi. Normal respiratory effort.  Psychiatric grossly normal mood and affect. Speech fluent and appropriate.  Data Reviewed:  -5.2 L since admission.  Weight down 7 kg since admission  BUN and creatinine stable. Magnesium normal. Potassium  normal.  Scheduled Meds: . amLODipine  10 mg Oral Daily  . feeding supplement (ENSURE COMPLETE)  237 mL Oral TID WC  . furosemide  80 mg Oral Daily  . gabapentin  100 mg Oral  QHS  . heparin  5,000 Units Subcutaneous Q8H  . metoprolol  75 mg Oral BID  . piperacillin-tazobactam (ZOSYN)  IV  3.375 g Intravenous Q8H  . sirolimus  1 mg Oral Daily  . sodium chloride  3 mL Intravenous Q12H  . sodium chloride  3 mL Intravenous Q12H  . tacrolimus  0.5 mg Oral BID  . tacrolimus  1 mg Oral BID   Continuous Infusions:   Principal Problem:   Sepsis secondary to UTI Active Problems:   CHF (congestive heart failure)   Acute respiratory failure with hypoxia   Encephalopathy   Sepsis   Acute diastolic heart failure   Pneumonia   Time spent 35 minutes

## 2013-01-18 NOTE — Clinical Social Work Psychosocial (Signed)
Clinical Social Work Department BRIEF PSYCHOSOCIAL ASSESSMENT 01/18/2013  Patient:  [name], [name]     Account Number:  [id number]     Admit date:  01/15/2013  Clinical Social Worker:  Wyatt Haste  Date/Time:  01/18/2013 10:30 AM  Referred by:  Physician  Date Referred:  01/18/2013 Referred for  SNF Placement   Other Referral:   Interview type:  Patient Other interview type:   and family    PSYCHOSOCIAL DATA Living Status:  HUSBAND Admitted from facility:   Level of care:   Primary support name:  Deidre Ala Primary support relationship to patient:  SPOUSE Degree of support available:   supportive    CURRENT CONCERNS Current Concerns  Post-Acute Placement   Other Concerns:    SOCIAL WORK ASSESSMENT / PLAN CSW met with pt and pt's spouse and children at bedside. Pt lives with her husband and her daughter lives in an apartment right behind them. She also has 2 sons, one who lives in [place]. Pt came to ED with multiple complaints. She had 4 falls in the week prior to admission. At baseline, pt is fairly independent with personal care. Her husband takes care of the house and cooking. Pt evaluated by PT and recommendation is for SNF. Pt initially very hesitant and family report they discussed this all yesterday and now pt is agreeable. Pt is encouraged as she performed better with PT today. She would like to stay in [place]. SNF list provided. Pt aware of [employer] authorization process and that if it is not approved prior to d/c, pt will have to consider private pay or return home. Family feel if it is not approved, they will be able to manage at home.   Assessment/plan status:  Psychosocial Support/Ongoing Assessment of Needs Other assessment/ plan:   Information/referral to community resources:   SNF list    PATIENT'S/FAMILY'S RESPONSE TO PLAN OF CARE: Pt and family agreeable to SNF at d/c for ST rehab. CSW will initiate bed search and [facility] authorization.       Benay Pike, [place]

## 2013-01-18 NOTE — Progress Notes (Signed)
Patients foley was removed per protocol @ 1800. Patient tolerated procedure and is aware of letting the nursing staff know of when she needs to use the restroom so that she does not get up alone. Patient is expected to void by 0000. Will continue to monitor patient.

## 2013-01-18 NOTE — Progress Notes (Signed)
Inpatient Diabetes Program Recommendations  AACE/ADA: New Consensus Statement on Inpatient Glycemic Control (2013)  Target Ranges:  Prepandial:   less than 140 mg/dL      Peak postprandial:   less than 180 mg/dL (1-2 hours)      Critically ill patients:  140 - 180 mg/dL   Results for DEJANIQUE, RUEHL (MRN [id number]) as of 01/18/2013 09:10  Ref. Range 01/15/2013 04:52 01/16/2013 04:32 01/17/2013 08:46 01/18/2013 05:31  Glucose Latest Range: 70-99 mg/dL 217 (H) 180 (H) 224 (H) 196 (H)    Inpatient Diabetes Program Recommendations Correction (SSI): Please order CBGs with Novolog correction ACHS. HgbA1C: Please consider ordering an A1C to evaluate glycemic control over the past 2-3 months. Diet: Please consider ordering Carb Modified diet.  Note: Patient has a history of diabetes and takes Amaryl 1mg  daily with supper as an outpatient for diabetes management.  Fasting glucose has ranged from 180-224 mg/dl over the past 4 mornings and blood glucose is not being monitored ACHS.  While inpatient, please order CBGs with Novolog ocrrection, an A1C, and change diet to carb modified.  Will continue to follow.  Thanks, Barnie Alderman, RN, MSN, CCRN Diabetes Coordinator Inpatient Diabetes Program [phone] (Team Pager) [phone] ([facility] office) [phone] [facility] office)

## 2013-01-18 NOTE — Clinical Social Work Placement (Signed)
Clinical Social Work Department CLINICAL SOCIAL WORK PLACEMENT NOTE 01/18/2013  Patient:  [name], [name]  Account Number:  [id number] Admit date:  01/15/2013  Clinical Social Worker:  Benay Pike, LCSW  Date/time:  01/18/2013 10:28 AM  Clinical Social Work is seeking post-discharge placement for this patient at the following level of care:   [place]   (*CSW will update this form in Epic as items are completed)   01/18/2013  Patient/family provided with [place] Department of Clinical Social Work's list of facilities offering this level of care within the geographic area requested by the patient (or if unable, by the patient's family).  01/18/2013  Patient/family informed of their freedom to choose among providers that offer the needed level of care, that participate in Medicare, Medicaid or managed care program needed by the patient, have an available bed and are willing to accept the patient.  01/18/2013  Patient/family informed of [facility]' ownership interest in [facility], as well as of the fact that they are under no obligation to receive care at this facility.  PASARR submitted to EDS on 01/18/2013 PASARR number received from EDS on 01/18/2013  FL2 transmitted to all facilities in geographic area requested by pt/family on  01/18/2013 FL2 transmitted to all facilities within larger geographic area on   Patient informed that his/her managed care company has contracts with or will negotiate with  certain facilities, including the following:     Patient/family informed of bed offers received:   Patient chooses bed at  Physician recommends and patient chooses bed at    Patient to be transferred to  on   Patient to be transferred to facility by   The following physician request were entered in Epic:   Additional Comments: [place], [place]

## 2013-01-18 NOTE — Progress Notes (Signed)
Physical Therapy Treatment Patient Details Name: [name] MRN: [id number] DOB: [birth year] Today's Date: 01/18/2013 Time: 5053-9767 PT Time Calculation (min): 35 min  PT Assessment / Plan / Recommendation  PT Comments   Pt seen with family at bedside. Pt appears to be progressing well. Pt displays improved activity tolerance and stability with gait. Pt displays weakness bilateral hip flexors (right > than left). Family appears to be very supportive. Pt tolerates treatment well.  Plan Current plan remains appropriate    Pertinent Vitals/Pain 0/10    Mobility  Bed Mobility Bed Mobility: Supine to Sit Supine to sit: Min assist Transfers Overall transfer level: Needs assistance Equipment used: Rolling walker (2 wheeled) Transfers: Sit to/from Stand Sit to Stand: Min assist Ambulation/Gait Ambulation/Gait assistance: Min assist Ambulation Distance (Feet): 40 Feet Assistive device: Rolling walker (2 wheeled) Gait Pattern/deviations: Trunk flexed;Decreased stride length;Decreased dorsiflexion - right;Decreased dorsiflexion - left Gait velocity: Slow General Gait Details: Pt has 2 LOB but is able to recover with min assist.    Exercises General Exercises - Lower Extremity Ankle Circles/Pumps: Both;10 reps;Supine Quad Sets: Both;10 reps;Supine Hip ABduction/ADduction: Both;10 reps;Supine Straight Leg Raises: Both;5 reps;Supine;AAROM    Visit Information  Last PT Received On: 01/18/13    Subjective Data  Pt states that she is feeling better today but she did not sleep well.  End of Session PT - End of Session Equipment Utilized During Treatment: Gait belt Activity Tolerance: Patient tolerated treatment well Patient left: in bed;with family/visitor present   Rachelle Hora, PTA 01/18/2013, 9:27 AM

## 2013-01-19 LAB — GLUCOSE, CAPILLARY
GLUCOSE-CAPILLARY: 200 mg/dL — AB (ref 70–99)
Glucose-Capillary: 204 mg/dL — ABNORMAL HIGH (ref 70–99)
Glucose-Capillary: 184 mg/dL — ABNORMAL HIGH (ref 70–99)

## 2013-01-19 LAB — CLOSTRIDIUM DIFFICILE BY PCR: CDIFFPCR: NEGATIVE

## 2013-01-19 MED ORDER — HYDRALAZINE HCL 20 MG/ML IJ SOLN: 10.0000 mg | Freq: Four times a day (QID) | INTRAMUSCULAR | Status: DC | PRN

## 2013-01-19 NOTE — Clinical Social Work Note (Addendum)
CSW presented bed offers and pt's family chooses [facility]. Facility notified and submitted precert for authorization. Spoke with Danae Chen at [facility] regarding SNF as well. Family aware they will need to bring Prograf and Rapamune from home.   Benay Pike, [place]

## 2013-01-19 NOTE — Clinical Social Work Placement (Signed)
Clinical Social Work Department CLINICAL SOCIAL WORK PLACEMENT NOTE 01/19/2013  Patient:  [name], [name]  Account Number:  [id number] Admit date:  01/15/2013  Clinical Social Worker:  Benay Pike, LCSW  Date/time:  01/18/2013 10:28 AM  Clinical Social Work is seeking post-discharge placement for this patient at the following level of care:   [place]   (*CSW will update this form in Epic as items are completed)   01/18/2013  Patient/family provided with [place] Department of Clinical Social Work's list of facilities offering this level of care within the geographic area requested by the patient (or if unable, by the patient's family).  01/18/2013  Patient/family informed of their freedom to choose among providers that offer the needed level of care, that participate in Medicare, Medicaid or managed care program needed by the patient, have an available bed and are willing to accept the patient.  01/18/2013  Patient/family informed of [facility]' ownership interest in [facility], as well as of the fact that they are under no obligation to receive care at this facility.  PASARR submitted to EDS on 01/18/2013 PASARR number received from EDS on 01/18/2013  FL2 transmitted to all facilities in geographic area requested by pt/family on  01/18/2013 FL2 transmitted to all facilities within larger geographic area on   Patient informed that his/her managed care company has contracts with or will negotiate with  certain facilities, including the following:     Patient/family informed of bed offers received:  01/19/2013 Patient chooses bed at [facility] Physician recommends and patient chooses bed at  [facility]  Patient to be transferred to  on   Patient to be transferred to facility by   The following physician request were entered in Epic:   Additional Comments: Maxville Linn, [place]

## 2013-01-19 NOTE — Progress Notes (Signed)
[facility] PROGRESS NOTE  [name] YSA:[id number] DOB: 29-May-1930 DOA: 01/15/2013 PCP: [name]., MD   Summary:  80 year old woman with history of end-stage renal disease, status post kidney transplant 2008 on antirejection therapy presented to the hospital with multiple complaints including falls, confusion for the last several months, failure to thrive, increasing lower extremity edema, shortness of breath. Initial impression sepsis, UTI, acute congestive heart failure, subacute encephalopathy. She has rapidly improved with IV diuresis, empiric treatment for possible pneumonia and UTI with sepsis. Plan transfer to skilled nursing facility in the next 48 hours..   Assessment/Plan:  1. UTI K. pneumoniae with sepsis with tachypnea and hypothermia on admission. Pansensitive. Improving on atx 2. Acute hypoxic respiratory failure secondary to sepsis and acute diastolic heart failure. Appears resolved. Hypoxia resolved. 3. Acute diastolic congestive heart failure. Acute component resolved. Appears euvolemic at this time. 4. Bilateral pleural effusions left greater than right rapidly improved with IV diuresis, asymptomatic. 5. DM. Appears stable. 6. Possible pneumonia. Appears to be resolved. Hypoxia has resolved.. 7. Recurrent falls at home. Physical therapy consultation appreciated, skilled nursing facility recommended. 8. Failure to thrive at home. Eating well now. 9. Subacute encephalopathy for several months superimposed on chronic dementia. Stable. 10. History of lower extremity DVT. Doppler this admission showed chronic left lower extremity DVT. No treatment indicated. 11. Status post renal transplant maintained on Prograf and Rapamune. 12. HTN cont home regimen, add hydrazine prn  13. Diarrhea of unclear etiology; r/o C diff due to atx exposure   D/c plan; pend C diff; SNF when bed is available  Code Status: DNR Family Communication: d/w friend, daughter in law  (indicate person spoken with, relationship, and if by phone, the number) Disposition Plan: SNF   Consultants:  None   Procedures:  None   Antibiotics: Zosyn 1/10 >> 1/13  Augmentin 1/13 >> 1/19     (indicate start date, and stop date if known)  HPI/Subjective: alert  Objective: Filed Vitals:   01/19/13 0624  BP: 171/64  Pulse: 83  Temp: 98.9 F (37.2 C)  Resp:     Intake/Output Summary (Last 24 hours) at 01/19/13 1057 Last data filed at 01/19/13 1050  Gross per 24 hour  Intake    600 ml  Output      0 ml  Net    600 ml   Filed Weights   01/17/13 0500 01/18/13 0500 01/19/13 0624  Weight: 71.1 kg (156 lb 12 oz) 67.314 kg (148 lb 6.4 oz) 65.9 kg (145 lb 4.5 oz)    Exam:   General:  alert  Cardiovascular: s1,s2 rrr  Respiratory: CTA BL  Abdomen: soft, nt, nd   Musculoskeletal: no LE edema   Data Reviewed: Basic Metabolic Panel:  Recent Labs Lab 01/15/13 0452 01/16/13 0432 01/17/13 0846 01/18/13 0531  NA 140 144 144 143  K 4.9 3.8 3.6* 3.8  CL 100 102 103 104  CO2 28 28 29 25   GLUCOSE 217* 180* 224* 196*  BUN 30* 24* 19 20  CREATININE 1.24* 1.27* 1.20* 1.23*  CALCIUM 10.7* 9.5 8.9 9.1  MG 2.0  --   --  1.5  PHOS 4.0  --   --   --    Liver Function Tests:  Recent Labs Lab 01/15/13 0452 01/16/13 0432  AST 25 15  ALT 11 8  ALKPHOS 72 64  BILITOT 0.5 0.6  PROT 7.3 6.6  ALBUMIN 3.9 3.1*   No results found for this basename: LIPASE, AMYLASE,  in the last 168 hours No results found for this basename: AMMONIA,  in the last 168 hours CBC:  Recent Labs Lab 01/15/13 0452 01/16/13 0432 01/17/13 0846  WBC 4.0 3.2* 3.6*  NEUTROABS 2.4  --   --   HGB 12.7 11.7* 12.1  HCT 40.5 36.9 37.5  MCV 87.1 86.6 85.8  PLT 177 145* PLATELET CLUMPS NOTED ON SMEAR, COUNT APPEARS ADEQUATE   Cardiac Enzymes:  Recent Labs Lab 01/15/13 1005 01/15/13 1546 01/15/13 2115 01/16/13 0432 01/16/13 0927  TROPONINI <0.30 <0.30 <0.30 <0.30 <0.30    BNP (last 3 results)  Recent Labs  01/15/13 0516  PROBNP 13226.0*   CBG:  Recent Labs Lab 01/15/13 0425 01/18/13 1156 01/18/13 1659 01/19/13 0729  GLUCAP 189* 263* 216* 200*    Recent Results (from the past 240 hour(s))  URINE CULTURE     Status: None   Collection Time    01/15/13  5:20 AM      Result Value Range Status   Specimen Description URINE, CATHETERIZED   Final   Special Requests NONE   Final   Culture  Setup Time     Final   Value: 01/15/2013 19:21     Performed at [facility]     Final   Value: >=100,000 COLONIES/ML     Performed at [facility]   Culture     Final   Value: KLEBSIELLA PNEUMONIAE     Performed at [facility]   Report Status 01/18/2013 FINAL   Final   Organism ID, Bacteria KLEBSIELLA PNEUMONIAE   Final  CULTURE, BLOOD (ROUTINE X 2)     Status: None   Collection Time    01/15/13  5:54 AM      Result Value Range Status   Specimen Description BLOOD LEFT HAND   Final   Special Requests     Final   Value: BOTTLES DRAWN AEROBIC AND ANAEROBIC 6CC EACH BOTTLE   Culture NO GROWTH 4 DAYS   Final   Report Status PENDING   Incomplete  CULTURE, BLOOD (ROUTINE X 2)     Status: None   Collection Time    01/15/13  6:01 AM      Result Value Range Status   Specimen Description BLOOD LEFT ARM   Final   Special Requests BOTTLES DRAWN AEROBIC ONLY 6CC BOTTLE   Final   Culture NO GROWTH 4 DAYS   Final   Report Status PENDING   Incomplete  MRSA PCR SCREENING     Status: None   Collection Time    01/15/13  9:50 AM      Result Value Range Status   MRSA by PCR NEGATIVE  NEGATIVE Final   Comment:            The GeneXpert MRSA Assay (FDA     approved for NASAL specimens     only), is one component of a     comprehensive MRSA colonization     surveillance program. It is not     intended to diagnose MRSA     infection nor to guide or     monitor treatment for     MRSA infections.     Studies: No results  found.  Scheduled Meds: . amLODipine  10 mg Oral Daily  . amoxicillin-clavulanate  1 tablet Oral Q12H  . feeding supplement (ENSURE COMPLETE)  237 mL Oral TID WC  . furosemide  80 mg Oral Daily  .  gabapentin  100 mg Oral QHS  . heparin  5,000 Units Subcutaneous Q8H  . insulin aspart  0-9 Units Subcutaneous TID WC  . metoprolol  75 mg Oral BID  . sirolimus  1 mg Oral Daily  . sodium chloride  3 mL Intravenous Q12H  . sodium chloride  3 mL Intravenous Q12H  . tacrolimus  0.5 mg Oral BID  . tacrolimus  1 mg Oral BID   Continuous Infusions:   Principal Problem:   Sepsis secondary to UTI Active Problems:   CHF (congestive heart failure)   Acute respiratory failure with hypoxia   Encephalopathy   Sepsis   Acute diastolic heart failure   Pneumonia    Time spent: >35 minutes    Kinnie Feil  [facility] Pager [phone]. If 7PM-7AM, please contact night-coverage at [email or web address], password [facility] 01/19/2013, 10:57 AM  LOS: 4 days

## 2013-01-19 NOTE — Progress Notes (Signed)
Inpatient Diabetes Program Recommendations  AACE/ADA: New Consensus Statement on Inpatient Glycemic Control (2013)  Target Ranges:  Prepandial:   less than 140 mg/dL      Peak postprandial:   less than 180 mg/dL (1-2 hours)      Critically ill patients:  140 - 180 mg/dL   Results for BEONCA, GIBB (MRN [id number]) as of 01/19/2013 08:48  Ref. Range 01/18/2013 11:56 01/18/2013 16:59 01/19/2013 07:29  Glucose-Capillary Latest Range: 70-99 mg/dL 263 (H) 216 (H) 200 (H)   Inpatient Diabetes Program Recommendations Correction (SSI): Please increase Novolog correction to moderate scale and add bedtime correction. HgbA1C: Please consider ordering an A1C to evaluate glycemic control over the past 2-3 months. Diet: Please consider changing diet from regular to Carb Modified Diabetic diet.  Thanks, Barnie Alderman, RN, MSN, CCRN Diabetes Coordinator Inpatient Diabetes Program [phone] (Team Pager) [phone] ([place] office) [phone] [facility] office)

## 2013-01-19 NOTE — Progress Notes (Signed)
Physical Therapy Treatment Patient Details Name: MONIGUE SPRAGGINS MRN: [id number] DOB: 1930/04/01 Today's Date: 01/19/2013 Time: 4801-6553 PT Time Calculation (min): 39 min  PT Assessment / Plan / Recommendation  History of Present Illness     PT Comments   Pt eager to complete therapy today.  Improved functional ability with min assistance required with bed mobilty, cueing for handplacement to assistance.  Increased gait distance with RW and min assistance required.  Pt left in chair with call bell and chair alarm set, nurse and MD in room.  Follow Up Recommendations         Does the patient have the potential to tolerate intense rehabilitation     Barriers to Discharge        Equipment Recommendations       Recommendations for Other Services    Frequency     Progress towards PT Goals Progress towards PT goals: Progressing toward goals  Plan      Precautions / Restrictions Precautions Precautions: Fall Restrictions Weight Bearing Restrictions: No    Mobility  Bed Mobility Overal bed mobility: Needs Assistance Bed Mobility: Supine to Sit Supine to sit: Min assist General bed mobility comments: requires cueing for handplacement to assist with bed mobility Transfers Overall transfer level: Needs assistance Equipment used: Rolling walker (2 wheeled) Transfers: Sit to/from Stand Sit to Stand: Min assist General transfer comment: cueing for handplacement to assist Ambulation/Gait Ambulation/Gait assistance: Min assist Ambulation Distance (Feet): 160 Feet Assistive device: Rolling walker (2 wheeled) Gait Pattern/deviations: Trunk flexed;Decreased dorsiflexion - right;Decreased dorsiflexion - left Gait velocity: [facility]    Exercises General Exercises - Lower Extremity Ankle Circles/Pumps: AROM;Both;10 reps;Seated Long Arc Quad: AROM;Both;10 reps;Seated   PT Diagnosis:    PT Problem List:   PT Treatment Interventions:     PT Goals (current goals can now be found in the  care plan section)    Visit Information  Last PT Received On: 01/19/13    Subjective Data   Pt reported better night last night, no current pain.   Cognition  Cognition Arousal/Alertness: Awake/alert Behavior During Therapy: WFL for tasks assessed/performed Overall Cognitive Status: Within Functional Limits for tasks assessed    Balance     End of Session PT - End of Session Equipment Utilized During Treatment: Gait belt Activity Tolerance: Patient tolerated treatment well Patient left: in chair;with call bell/phone within reach;with chair alarm set;with family/visitor present;with nursing/sitter in room Nurse Communication: Mobility status   GP     Aldona Lento 01/19/2013, 12:02 PM

## 2013-01-20 ENCOUNTER — Inpatient Hospital Stay
Admission: RE | Admit: 2013-01-20 | Discharge: 2013-01-31 | Disposition: A | Discharge status: Other Acute Inpatient Hospital | Payer: Medicare PPO | Source: Ambulatory Visit | Attending: Internal Medicine | Admitting: Internal Medicine

## 2013-01-20 DIAGNOSIS — M79606 Pain in leg, unspecified: Principal | ICD-10-CM

## 2013-01-20 LAB — GLUCOSE, CAPILLARY
GLUCOSE-CAPILLARY: 184 mg/dL — AB (ref 70–99)
Glucose-Capillary: 181 mg/dL — ABNORMAL HIGH (ref 70–99)

## 2013-01-20 LAB — CULTURE, BLOOD (ROUTINE X 2)
CULTURE: NO GROWTH
Culture: NO GROWTH

## 2013-01-20 MED ORDER — AMOXICILLIN-POT CLAVULANATE 875-125 MG PO TABS: 1.0000 | ORAL_TABLET | Freq: Two times a day (BID) | ORAL | Status: DC

## 2013-01-20 MED ORDER — ALPRAZOLAM 1 MG PO TABS: 1.0000 mg | ORAL_TABLET | Freq: Every day | ORAL | Status: DC

## 2013-01-20 NOTE — Clinical Social Work Note (Signed)
Pt d/c today to [facility]. Facility received [employer] authorization. Pt, family, and facility aware of d/c. D/C summary faxed. Pt to transfer with RN.  Benay Pike, [place]

## 2013-01-20 NOTE — Clinical Social Work Placement (Signed)
Clinical Social Work Department CLINICAL SOCIAL WORK PLACEMENT NOTE 01/20/2013  Patient:  [name], [name]  Account Number:  [id number] Admit date:  01/15/2013  Clinical Social Worker:  Benay Pike, LCSW  Date/time:  01/18/2013 10:28 AM  Clinical Social Work is seeking post-discharge placement for this patient at the following level of care:   [place]   (*CSW will update this form in Epic as items are completed)   01/18/2013  Patient/family provided with [place] Department of Clinical Social Work's list of facilities offering this level of care within the geographic area requested by the patient (or if unable, by the patient's family).  01/18/2013  Patient/family informed of their freedom to choose among providers that offer the needed level of care, that participate in Medicare, Medicaid or managed care program needed by the patient, have an available bed and are willing to accept the patient.  01/18/2013  Patient/family informed of [facility]' ownership interest in [facility], as well as of the fact that they are under no obligation to receive care at this facility.  PASARR submitted to EDS on 01/18/2013 PASARR number received from EDS on 01/18/2013  FL2 transmitted to all facilities in geographic area requested by pt/family on  01/18/2013 FL2 transmitted to all facilities within larger geographic area on   Patient informed that his/her managed care company has contracts with or will negotiate with  certain facilities, including the following:     Patient/family informed of bed offers received:  01/19/2013 Patient chooses bed at [facility] Physician recommends and patient chooses bed at  [facility]  Patient to be transferred to [facility] on  01/20/2013 Patient to be transferred to facility by RN  The following physician request were entered in Epic:   Additional Comments: Clearence Cheek, [place]

## 2013-01-20 NOTE — Discharge Summary (Signed)
Physician Discharge Summary  [name] [id number] DOB: 06/29/1930 DOA: 01/15/2013  PCP: [name]., MD  Admit date: 01/15/2013 Discharge date: 01/20/2013  Time spent: >35 minutes  Recommendations for Outpatient Follow-up:  F/u with PCP in 1-2 week s post rehab  F/u with nephrology outpatient in 3-4 weeks   Discharge Diagnoses:  Principal Problem:   Sepsis secondary to UTI Active Problems:   CHF (congestive heart failure)   Acute respiratory failure with hypoxia   Encephalopathy   Sepsis   Acute diastolic heart failure   Pneumonia   Discharge Condition: stable   Diet recommendation: DM  Filed Weights   01/18/13 0500 01/19/13 0624 01/20/13 0512  Weight: 67.314 kg (148 lb 6.4 oz) 65.9 kg (145 lb 4.5 oz) 65.334 kg (144 lb 0.6 oz)    History of present illness:  80 year old woman with history of end-stage renal disease, status post kidney transplant 2008 on antirejection therapy presented to the hospital with multiple complaints including falls, confusion for the last several months, failure to thrive, increasing lower extremity edema, shortness of breath. Initial impression sepsis, UTI, acute congestive heart failure, subacute encephalopathy. She has rapidly improved with IV diuresis, empiric treatment for possible pneumonia and UTI with sepsis.   Hospital Course:   1. UTI K. pneumoniae with sepsis with tachypnea and hypothermia on admission. Pansensitive. Improving on atx, changed to PO  2. Acute hypoxic respiratory failure secondary to sepsis and acute diastolic heart failure. Appears resolved. Hypoxia resolved. 3. Acute diastolic congestive heart failure. Acute component resolved. Appears euvolemic at this time. Cont PO lasix 4. Bilateral pleural effusions left greater than right rapidly improved with IV diuresis, asymptomatic. 5. DM. Appears stable. 6. Possible pneumonia. Appears to be resolved. Hypoxia has resolved.. 7. Recurrent falls at home. Physical  therapy consultation appreciated, skilled nursing facility recommended. 8. Failure to thrive at home. Eating well now. 9. Subacute encephalopathy for several months superimposed on chronic dementia. Stable. 10. History of lower extremity DVT. Doppler this admission showed chronic left lower extremity DVT. No treatment indicated. 11. Status post renal transplant maintained on Prograf and Rapamune. Outpatient nephrology follow up  12. HTN cont home regimen, titrate as needed   13. Diarrhea of unclear etiology; resolved  Procedures:  None  Antibiotics:  Zosyn 1/10 >> 1/13  Augmentin 1/13 >> 1/19  Discharge Exam: Filed Vitals:   01/20/13 0512  BP: 166/58  Pulse: 69  Temp: 98.4 F (36.9 C)  Resp: 20    General: alert Cardiovascular: s1,s2 rrr Respiratory: CTA B  Discharge Instructions  Discharge Orders   Future Orders Complete By Expires   Diet - low sodium heart healthy  As directed    Discharge instructions  As directed    Comments:     Please follow up with primary care doctor in 1-2 weeks post rehab   Increase activity slowly  As directed        Medication List    STOP taking these medications       magnesium oxide 400 MG tablet  Commonly known as:  MAG-OX      TAKE these medications       ALPRAZolam 1 MG tablet  Commonly known as:  XANAX  Take 1 tablet (1 mg total) by mouth at bedtime.     amLODipine 10 MG tablet  Commonly known as:  NORVASC  Take 10 mg by mouth daily.     amoxicillin-clavulanate 875-125 MG per tablet  Commonly known as:  AUGMENTIN  Take 1  tablet by mouth every 12 (twelve) hours.     calcitRIOL 0.25 MCG capsule  Commonly known as:  ROCALTROL  Take 0.25 mcg by mouth every Monday, Wednesday, and Friday.     calcium carbonate 600 MG Tabs tablet  Commonly known as:  OS-CAL  Take 600 mg by mouth 2 (two) times daily with a meal.     diphenhydramine-acetaminophen 25-500 MG Tabs  Commonly known as:  TYLENOL PM  Take 2 tablets by mouth  at bedtime as needed (For sleep).     furosemide 40 MG tablet  Commonly known as:  LASIX  Take 80 mg by mouth.     gabapentin 100 MG capsule  Commonly known as:  NEURONTIN  Take 100 mg by mouth at bedtime.     glimepiride 1 MG tablet  Commonly known as:  AMARYL  Take 1 mg by mouth daily with supper.     metoprolol 50 MG tablet  Commonly known as:  LOPRESSOR  Take 75 mg by mouth 2 (two) times daily.     multivitamin tablet  Take 1 tablet by mouth daily.     sirolimus 1 MG tablet  Commonly known as:  RAPAMUNE  Take 1 mg by mouth daily.     tacrolimus 0.5 MG capsule  Commonly known as:  PROGRAF  Take 0.5 mg by mouth 2 (two) times daily.     tacrolimus 1 MG capsule  Commonly known as:  PROGRAF  Take 1 mg by mouth 2 (two) times daily.       Allergies  Allergen Reactions  . Codeine     REACTION: UNKNOWN REACTION       Follow-up Information   Follow up with [name]., MD In 1 week.   Specialty:  Internal Medicine   Contact information:   [address] [place] [address] [phone]        The results of significant diagnostics from this hospitalization (including imaging, microbiology, ancillary and laboratory) are listed below for reference.    Significant Diagnostic Studies: Ct Abdomen Pelvis Wo Contrast  01/15/2013   CLINICAL DATA:  Fall.  Altered mental status.  EXAM: CT ABDOMEN AND PELVIS WITHOUT CONTRAST  TECHNIQUE: Multidetector CT imaging of the abdomen and pelvis was performed following the standard protocol without intravenous contrast.  COMPARISON:  None.  FINDINGS: The patient has large bilateral pleural effusions. There is cardiomegaly. No pericardial effusion. Compressive atelectasis in lung bases is noted.  The right kidney is markedly atrophic with multiple cysts identified. There is a cystic structure in the left upper quadrant with dense rim calcification which may represent the patient's left kidney or adrenal  gland. The left adrenal gland is not definitely visualized. There is a right lower quadrant renal transplant with 2-3 small lobules of gas the renal collecting system likely related to Foley catheterization. The patient is status post cholecystectomy. The liver, spleen and pancreas are unremarkable. Extensive atherosclerotic vascular disease is seen. The patient is status post hysterectomy. There is a trace amount of free pelvic fluid. The stomach and small and large bowel appear normal. Diffuse body wall edema is noted. The patient is status post hysterectomy. No lymphadenopathy is seen. No fracture is seen. No lytic or sclerotic bony lesion is seen with multilevel lumbar spondylosis noted.  IMPRESSION: Large appearing bilateral pleural effusions with associated compressive basilar atelectasis.  Cardiomegaly.  Trace amount of free pelvic fluid is nonspecific but could be due to IV fluid resuscitation or anasarca with  diffuse body wall edema noted.  Tiny amount of air in the right lower quadrant renal transplant is likely related to Foley catheterization.   Electronically Signed   By: Inge Rise M.D.   On: 01/15/2013 05:52   Ct Head Wo Contrast  01/15/2013   CLINICAL DATA:  Altered mental status.  Status post fall.  EXAM: CT HEAD WITHOUT CONTRAST  CT CERVICAL SPINE WITHOUT CONTRAST  TECHNIQUE: Multidetector CT imaging of the head and cervical spine was performed following the standard protocol without intravenous contrast. Multiplanar CT image reconstructions of the cervical spine were also generated.  COMPARISON:  Cervical spine CT scan 02/28/2003. Brain MRI 07/23/2005.  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 Chronic microvascular ischemic change is seen. No evidence of acute abnormality including infarction, hemorrhage, mass lesion, mass effect, midline shift or abnormal extra-axial fluid collection. Mucosal thickening bilateral maxillary sinuses and is seen.  There is some scattered ethmoid air cell disease. Prosthetic left globe is noted. The calvarium is intact.  CT CERVICAL SPINE FINDINGS  No fracture is identified. Mild anterolisthesis C4 on C7 and C7 on T1 due to facet arthropathy is noted. Anterior endplate spurring is seen. The patient has large appearing bilateral pleural effusions. Interlobular septal thickening in the apices is noted.  IMPRESSION: No acute finding head or cervical spine.  Chronic microvascular ischemic change and remote right parieto-occipital infarct.  Large appearing bilateral pleural effusions and interlobular septal thickening compatible with pulmonary edema.   Electronically Signed   By: Inge Rise M.D.   On: 01/15/2013 05:46   Ct Cervical Spine Wo Contrast  01/15/2013   CLINICAL DATA:  Altered mental status.  Status post fall.  EXAM: CT HEAD WITHOUT CONTRAST  CT CERVICAL SPINE WITHOUT CONTRAST  TECHNIQUE: Multidetector CT imaging of the head and cervical spine was performed following the standard protocol without intravenous contrast. Multiplanar CT image reconstructions of the cervical spine were also generated.  COMPARISON:  Cervical spine CT scan 02/28/2003. Brain MRI 07/23/2005.  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 Chronic microvascular ischemic change is seen. No evidence of acute abnormality including infarction, hemorrhage, mass lesion, mass effect, midline shift or abnormal extra-axial fluid collection. Mucosal thickening bilateral maxillary sinuses and is seen. There is some scattered ethmoid air cell disease. Prosthetic left globe is noted. The calvarium is intact.  CT CERVICAL SPINE FINDINGS  No fracture is identified. Mild anterolisthesis C4 on C7 and C7 on T1 due to facet arthropathy is noted. Anterior endplate spurring is seen. The patient has large appearing bilateral pleural effusions. Interlobular septal thickening in the apices is noted.  IMPRESSION: No acute finding  head or cervical spine.  Chronic microvascular ischemic change and remote right parieto-occipital infarct.  Large appearing bilateral pleural effusions and interlobular septal thickening compatible with pulmonary edema.   Electronically Signed   By: Inge Rise M.D.   On: 01/15/2013 05:46   US Venous Img Lower Bilateral  01/15/2013   CLINICAL DATA:  Bilateral lower extremity edema.  EXAM: BILATERAL LOWER EXTREMITY VENOUS DOPPLER ULTRASOUND  TECHNIQUE: Gray-scale sonography with graded compression, as well as color Doppler and duplex ultrasound, were performed to evaluate the deep venous system from the level of the common femoral vein through the popliteal and proximal calf veins. Spectral Doppler was utilized to evaluate flow at rest and with distal augmentation maneuvers.  COMPARISON:  None.  FINDINGS: Thrombus within deep veins: No DVT is identified in the right lower  extremity. The left femoral vein is partially compressible in the mid thigh and over a segment demonstrates evidence of nonocclusive mural thrombus. Part of this thrombus appears echogenic and this may represent chronic thrombus. No occlusive thrombus is identified in the left lower extremity.  Duplex waveform respiratory phasicity:  Normal.  Duplex waveform response to augmentation:  Normal.  Venous reflux:  None visualized.  Other findings: No evidence of superficial thrombophlebitis or abnormal fluid collection.  IMPRESSION: Nonocclusive mural thrombus in the femoral vein of the left thigh. Based on appearance, this may be chronic thrombus.   Electronically Signed   By: Aletta Edouard M.D.   On: 01/15/2013 11:21   Dg Chest Port 1 View  01/17/2013   CLINICAL DATA:  CHF and bilateral pleural effusions.  EXAM: PORTABLE CHEST - 1 VIEW  COMPARISON:  01/16/2013  FINDINGS: Less prominent pleural effusion and lower lobe atelectasis on the left with residual atelectasis/ consolidation remaining as well as some remaining left pleural fluid. The  right pleural effusion is relatively stable. Perihilar atelectasis on the right has improved. Interstitial edema is relatively stable.  IMPRESSION: Less prominent left pleural effusion and left lower lobe atelectasis. Decrease in right perihilar atelectasis.   Electronically Signed   By: Aletta Edouard M.D.   On: 01/17/2013 08:00   Dg Chest Port 1 View  01/16/2013   CLINICAL DATA:  CHF  EXAM: PORTABLE CHEST - 1 VIEW  COMPARISON:  01/15/2013  FINDINGS: Heart remains enlarged with diffuse edema pattern throughout the lungs. Enlarging pleural effusions present. Increased right hilar and bibasilar atelectasis/ collapse. No pneumothorax. Previous left proximal humerus ORIF for a remote fracture.  IMPRESSION: Persistent CHF pattern.  Enlarging pleural effusions with worsening bibasilar compressive atelectasis/collapse.  Increased right perihilar atelectasis as well.   Electronically Signed   By: Daryll Brod M.D.   On: 01/16/2013 07:48   Dg Chest Port 1 View  01/15/2013   CLINICAL DATA:  Altered mental status.  Fall, weakness.  EXAM: PORTABLE CHEST - 1 VIEW  COMPARISON:  Plain film of the chest 05/06/2007.  FINDINGS: There is extensive bilateral airspace disease. Pleural effusions are identified and appear greater on the right. Cardiomegaly is seen.  IMPRESSION: Appears the chest most compatible with congestive heart failure with associated pleural effusions and basilar atelectasis.   Electronically Signed   By: Inge Rise M.D.   On: 01/15/2013 05:05    Microbiology: Recent Results (from the past 240 hour(s))  URINE CULTURE     Status: None   Collection Time    01/15/13  5:20 AM      Result Value Range Status   Specimen Description URINE, CATHETERIZED   Final   Special Requests NONE   Final   Culture  Setup Time     Final   Value: 01/15/2013 19:21     Performed at [place]     Final   Value: >=100,000 COLONIES/ML     Performed at [facility]   Culture      Final   Value: KLEBSIELLA PNEUMONIAE     Performed at [facility]   Report Status 01/18/2013 FINAL   Final   Organism ID, Bacteria KLEBSIELLA PNEUMONIAE   Final  CULTURE, BLOOD (ROUTINE X 2)     Status: None   Collection Time    01/15/13  5:54 AM      Result Value Range Status   Specimen Description BLOOD LEFT HAND   Final  Special Requests     Final   Value: BOTTLES DRAWN AEROBIC AND ANAEROBIC 6CC EACH BOTTLE   Culture NO GROWTH 4 DAYS   Final   Report Status PENDING   Incomplete  CULTURE, BLOOD (ROUTINE X 2)     Status: None   Collection Time    01/15/13  6:01 AM      Result Value Range Status   Specimen Description BLOOD LEFT ARM   Final   Special Requests BOTTLES DRAWN AEROBIC ONLY 6CC BOTTLE   Final   Culture NO GROWTH 4 DAYS   Final   Report Status PENDING   Incomplete  MRSA PCR SCREENING     Status: None   Collection Time    01/15/13  9:50 AM      Result Value Range Status   MRSA by PCR NEGATIVE  NEGATIVE Final   Comment:            The GeneXpert MRSA Assay (FDA     approved for NASAL specimens     only), is one component of a     comprehensive MRSA colonization     surveillance program. It is not     intended to diagnose MRSA     infection nor to guide or     monitor treatment for     MRSA infections.  CLOSTRIDIUM DIFFICILE BY PCR     Status: None   Collection Time    01/19/13  3:46 PM      Result Value Range Status   C difficile by pcr NEGATIVE  NEGATIVE Final     Labs: Basic Metabolic Panel:  Recent Labs Lab 01/15/13 0452 01/16/13 0432 01/17/13 0846 01/18/13 0531  NA 140 144 144 143  K 4.9 3.8 3.6* 3.8  CL 100 102 103 104  CO2 28 28 29 25   GLUCOSE 217* 180* 224* 196*  BUN 30* 24* 19 20  CREATININE 1.24* 1.27* 1.20* 1.23*  CALCIUM 10.7* 9.5 8.9 9.1  MG 2.0  --   --  1.5  PHOS 4.0  --   --   --    Liver Function Tests:  Recent Labs Lab 01/15/13 0452 01/16/13 0432  AST 25 15  ALT 11 8  ALKPHOS 72 64  BILITOT 0.5 0.6  PROT 7.3  6.6  ALBUMIN 3.9 3.1*   No results found for this basename: LIPASE, AMYLASE,  in the last 168 hours No results found for this basename: AMMONIA,  in the last 168 hours CBC:  Recent Labs Lab 01/15/13 0452 01/16/13 0432 01/17/13 0846  WBC 4.0 3.2* 3.6*  NEUTROABS 2.4  --   --   HGB 12.7 11.7* 12.1  HCT 40.5 36.9 37.5  MCV 87.1 86.6 85.8  PLT 177 145* PLATELET CLUMPS NOTED ON SMEAR, COUNT APPEARS ADEQUATE   Cardiac Enzymes:  Recent Labs Lab 01/15/13 1005 01/15/13 1546 01/15/13 2115 01/16/13 0432 01/16/13 0927  TROPONINI <0.30 <0.30 <0.30 <0.30 <0.30   BNP: BNP (last 3 results)  Recent Labs  01/15/13 0516  PROBNP 13226.0*   CBG:  Recent Labs Lab 01/19/13 0729 01/19/13 1137 01/19/13 1617 01/19/13 2203 01/20/13 0736  GLUCAP 200* 204* 184* 181* 184*       Signed:  Shervin Cypert N  [facility] 01/20/2013, 8:53 AM

## 2013-01-21 ENCOUNTER — Other Ambulatory Visit: Payer: Self-pay | Admitting: *Deleted

## 2013-01-21 LAB — GLUCOSE, CAPILLARY
GLUCOSE-CAPILLARY: 232 mg/dL — AB (ref 70–99)
GLUCOSE-CAPILLARY: 217 mg/dL — AB (ref 70–99)
Glucose-Capillary: 194 mg/dL — ABNORMAL HIGH (ref 70–99)
Glucose-Capillary: 195 mg/dL — ABNORMAL HIGH (ref 70–99)

## 2013-01-21 MED ORDER — ALPRAZOLAM 1 MG PO TABS: 1.0000 mg | ORAL_TABLET | Freq: Every day | ORAL | Status: DC

## 2013-01-21 NOTE — Telephone Encounter (Signed)
rx filled per protocol  

## 2013-01-22 LAB — GLUCOSE, CAPILLARY
GLUCOSE-CAPILLARY: 233 mg/dL — AB (ref 70–99)
GLUCOSE-CAPILLARY: 187 mg/dL — AB (ref 70–99)
GLUCOSE-CAPILLARY: 185 mg/dL — AB (ref 70–99)

## 2013-01-23 LAB — GLUCOSE, CAPILLARY
GLUCOSE-CAPILLARY: 202 mg/dL — AB (ref 70–99)
GLUCOSE-CAPILLARY: 172 mg/dL — AB (ref 70–99)
Glucose-Capillary: 192 mg/dL — ABNORMAL HIGH (ref 70–99)
Glucose-Capillary: 201 mg/dL — ABNORMAL HIGH (ref 70–99)

## 2013-01-24 ENCOUNTER — Non-Acute Institutional Stay: Payer: Medicare PPO | Admitting: Internal Medicine

## 2013-01-24 DIAGNOSIS — I5030 Unspecified diastolic (congestive) heart failure: Secondary | ICD-10-CM

## 2013-01-24 DIAGNOSIS — E1165 Type 2 diabetes mellitus with hyperglycemia: Secondary | ICD-10-CM

## 2013-01-24 DIAGNOSIS — R269 Unspecified abnormalities of gait and mobility: Secondary | ICD-10-CM

## 2013-01-24 DIAGNOSIS — E1149 Type 2 diabetes mellitus with other diabetic neurological complication: Secondary | ICD-10-CM

## 2013-01-24 DIAGNOSIS — E1129 Type 2 diabetes mellitus with other diabetic kidney complication: Secondary | ICD-10-CM

## 2013-01-24 LAB — GLUCOSE, CAPILLARY
GLUCOSE-CAPILLARY: 185 mg/dL — AB (ref 70–99)
GLUCOSE-CAPILLARY: 181 mg/dL — AB (ref 70–99)
Glucose-Capillary: 98 mg/dL (ref 70–99)
Glucose-Capillary: 173 mg/dL — ABNORMAL HIGH (ref 70–99)
Glucose-Capillary: 228 mg/dL — ABNORMAL HIGH (ref 70–99)

## 2013-01-24 NOTE — Progress Notes (Signed)
Patient ID: [name], female   DOB: [birth year], 80 y.o.   MRN: [id number]  Facility; [facility] Chief complaint; admission to SNF post admit to [place] from 1/10 to 1/15  History; this is a 80 year old woman with end-stage renal disease status post kidney transplant in 2008. She presented with falls confusion failure to thrive and increasing lower extremity edema with shortness of breath over the last several months. She was felt to be septic from a UTI. She was felt to have congestive heart failure and subacute delirium. She rapidly improved with IV diuretics empiric treatment for possible pneumonia and a UTI. Urine culture showed Klebsiella. Blood cultures were negative. Echocardiogram was done suggesting diastolic congestive heart failure. She had large bilateral pleural effusions left greater than right that rapidly per improved with IV diuretics. Patient states she had a very restricted exercise tolerance. Multiple falls noted at home with no true syncope  Results for [name] (MRN [id number]) as of 01/24/2013 10:49  Ref. Range 01/16/2013 04:32 01/16/2013 09:27 01/17/2013 08:46 01/18/2013 05:31 01/19/2013 15:46  Sodium Latest Range: 137-147 mEq/L 144  144 143   Potassium Latest Range: 3.7-5.3 mEq/L 3.8  3.6 (L) 3.8   Chloride Latest Range: 96-112 mEq/L 102  103 104   CO2 Latest Range: 19-32 mEq/L 28  29 25    BUN Latest Range: 6-23 mg/dL 24 (H)  19 20   Creatinine Latest Range: 0.50-1.10 mg/dL 1.27 (H)  1.20 (H) 1.23 (H)   Calcium Latest Range: 8.4-10.5 mg/dL 9.5  8.9 9.1   GFR calc non Af Amer Latest Range: >90 mL/min 39 (L)  42 (L) 40 (L)   GFR calc Af Amer Latest Range: >90 mL/min 45 (L)  48 (L) 47 (L)   Glucose Latest Range: 70-99 mg/dL 180 (H)  224 (H) 196 (H)   Magnesium Latest Range: 1.5-2.5 mg/dL    1.5   Alkaline Phosphatase Latest Range: 39-117 U/L 64      Albumin Latest Range: 3.5-5.2 g/dL 3.1 (L)      AST Latest Range: 0-37 U/L 15      ALT Latest Range: 0-35 U/L 8      Total  Protein Latest Range: 6.0-8.3 g/dL 6.6      Total Bilirubin Latest Range: 0.3-1.2 mg/dL 0.6      Troponin I Latest Range: <0.30 ng/mL <0.30 <0.30     WBC Latest Range: 4.0-10.5 K/uL 3.2 (L)  3.6 (L)    RBC Latest Range: 3.87-5.11 MIL/uL 4.26  4.37    Hemoglobin Latest Range: 12.0-15.0 g/dL 11.7 (L)  12.1    HCT Latest Range: 36.0-46.0 % 36.9  37.5    MCV Latest Range: 78.0-100.0 fL 86.6  85.8    MCH Latest Range: 26.0-34.0 pg 27.5  27.7    MCHC Latest Range: 30.0-36.0 g/dL 31.7  32.3    RDW Latest Range: 11.5-15.5 % 16.2 (H)  15.8 (H)    Platelets Latest Range: 150-400 K/uL 145 (L)  PLATELET CLUMPS NOTED ON SMEAR, COUNT APPEARS ADEQUATE    C difficile by pcr Latest Range: NEGATIVE      NEGATIVE  CLOSTRIDIUM DIFFICILE BY PCR No range found     Rpt          CLINICAL DATA:  Fall.  Altered mental status.   EXAM: CT ABDOMEN AND PELVIS WITHOUT CONTRAST   TECHNIQUE: Multidetector CT imaging of the abdomen and pelvis was performed following the standard protocol without intravenous contrast.   COMPARISON:  None.   FINDINGS:  The patient has large bilateral pleural effusions. There is cardiomegaly. No pericardial effusion. Compressive atelectasis in lung bases is noted.   The right kidney is markedly atrophic with multiple cysts identified. There is a cystic structure in the left upper quadrant with dense rim calcification which may represent the patient's left kidney or adrenal gland. The left adrenal gland is not definitely visualized. There is a right lower quadrant renal transplant with 2-3 small lobules of gas the renal collecting system likely related to Foley catheterization. The patient is status post cholecystectomy. The liver, spleen and pancreas are unremarkable. Extensive atherosclerotic vascular disease is seen. The patient is status post hysterectomy. There is a trace amount of free pelvic fluid. The stomach and small and large bowel appear normal. Diffuse body wall  edema is noted. The patient is status post hysterectomy. No lymphadenopathy is seen. No fracture is seen. No lytic or sclerotic bony lesion is seen with multilevel lumbar spondylosis noted.   IMPRESSION: Large appearing bilateral pleural effusions with associated compressive basilar atelectasis.   Cardiomegaly.   Trace amount of free pelvic fluid is nonspecific but could be due to IV fluid resuscitation or anasarca with diffuse body wall edema noted.   Tiny amount of air in the right lower quadrant renal transplant is likely related to Foley catheterization.     Electronically Signed   By: Inge Rise M.D.   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 Chronic microvascular ischemic change is seen. No evidence of acute abnormality including infarction, hemorrhage, mass lesion, mass effect, midline shift or abnormal extra-axial fluid collection. Mucosal thickening bilateral maxillary sinuses and is seen. There is some scattered ethmoid air cell disease. Prosthetic left globe is noted. The calvarium is intact.   CT CERVICAL SPINE FINDINGS   No fracture is identified. Mild anterolisthesis C4 on C7 and C7 on T1 due to facet arthropathy is noted. Anterior endplate spurring is seen. The patient has large appearing bilateral pleural effusions. Interlobular septal thickening in the apices is noted.   IMPRESSION: No acute finding head or cervical spine.   Chronic microvascular ischemic change and remote right parieto-occipital infarct.   Large appearing bilateral pleural effusions and interlobular septal thickening compatible with pulmonary edema.     TECHNIQUE: Multidetector CT imaging of the head and cervical spine was performed following the standard protocol without intravenous contrast. Multiplanar CT image reconstructions of the cervical spine were also generated.   COMPARISON:  Cervical spine CT  scan 02/28/2003. Brain MRI 07/23/2005.   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 Chronic microvascular ischemic change is seen. No evidence of acute abnormality including infarction, hemorrhage, mass lesion, mass effect, midline shift or abnormal extra-axial fluid collection. Mucosal thickening bilateral maxillary sinuses and is seen. There is some scattered ethmoid air cell disease. Prosthetic left globe is noted. The calvarium is intact.   CT CERVICAL SPINE FINDINGS   No fracture is identified. Mild anterolisthesis C4 on C7 and C7 on T1 due to facet arthropathy is noted. Anterior endplate spurring is seen. The patient has large appearing bilateral pleural effusions. Interlobular septal thickening in the apices is noted.   IMPRESSION: No acute finding head or cervical spine.   Chronic microvascular ischemic change and remote right parieto-occipital infarct.   Large appearing bilateral pleural effusions and interlobular septal thickening compatible with pulmonary edema.     Electronically Signed   By: Inge Rise M.D.  [id number]   ------------------------------------------------------------ Transthoracic Echocardiography  Patient:    [name], [name] MR #:       [id number] Study Date: 01/16/2013 Gender:     F Age:        85 Height:     172.7cm Weight:     74.2kg BSA:        1.70m^2 Pt. Status: Room:    ATTENDING    Noemi Chapel  SONOGRAPHER  Cindy Hazy, RDCS  ADMITTING    Ghimire, Shanker  Larkin Ina  PERFORMING   Chmg, Forestine Na cc:  ------------------------------------------------------------ LV EF: 60% -   65%  ------------------------------------------------------------ Indications:      CHF - 428.0.  ------------------------------------------------------------ History:   PMH:  CHF. Acute respiratory failure. sepsis. History of Tuberculosis. History of  MVP.  Risk factors: Hypertension.  ------------------------------------------------------------ Study Conclusions  - Left ventricle: The cavity size was normal. Wall thickness   was increased in a pattern of moderate LVH. Systolic   function was normal. The estimated ejection fraction was   in the range of 60% to 65%. Wall motion was normal; there   were no regional wall motion abnormalities. Features are   consistent with a pseudonormal left ventricular filling   pattern, with concomitant abnormal relaxation and   increased filling pressure (grade 2 diastolic   dysfunction). Doppler parameters are consistent with high   ventricular filling pressure. - Aortic valve: Mildly calcified annulus. Trileaflet; mildly   thickened, mildly calcified leaflets. - Mitral valve: No significant mitral valve prolapse noted.   Calcified annulus. Mildly thickened leaflets . - Left atrium: The atrium was moderately dilated. - Right atrium: The atrium was mildly dilated. - Systemic veins: IVC mildly dilated with normal   respirophasic response (estimated CVP of 8 mmHg). - Pericardium, extracardiac: Pleural effusion with stranding   noted. Transthoracic echocardiography.  M-mode, complete 2D, spectral Doppler, and color Doppler.  Height:  Height: 172.7cm. Height: 68in.  Weight:  Weight: 74.2kg. Weight: 163.2lb.  Body mass index:  BMI: 24.9kg/m^2.  Body surface area:    BSA: 1.55m^2.  Blood pressure:     162/54.  Patient status:  Inpatient.  Location:  ICU/CCU  ------------------------------------------------------------  ------------------------------------------------------------ Left ventricle:  The cavity size was normal. Wall thickness was increased in a pattern of moderate LVH. Systolic function was normal. The estimated ejection fraction was in the range of 60% to 65%. Wall motion was normal; there were no regional wall motion abnormalities. Features are consistent with a pseudonormal left  ventricular filling pattern, with concomitant abnormal relaxation and increased filling pressure (grade 2 diastolic dysfunction). Doppler parameters are consistent with high ventricular filling pressure.  ------------------------------------------------------------ Aortic valve:   Mildly calcified annulus. Trileaflet; mildly thickened, mildly calcified leaflets.  Doppler:   No regurgitation.  ------------------------------------------------------------ Aorta:  Aortic root: The aortic root was normal in size.  ------------------------------------------------------------ Mitral valve:  No significant mitral valve prolapse noted. Calcified annulus. Mildly thickened leaflets .  Doppler: Peak gradient: 55mm Hg (D).  ------------------------------------------------------------ Left atrium:  The atrium was moderately dilated.  ------------------------------------------------------------ Atrial septum:  A patent foramen ovale cannot be excluded.   ------------------------------------------------------------ Right ventricle:  The cavity size was normal. Wall thickness was normal. Systolic function was normal.  ------------------------------------------------------------ Pulmonic valve:   Poorly visualized.  Doppler:   No significant regurgitation.  ------------------------------------------------------------ Tricuspid valve:   Structurally normal valve.   Leaflet separation was normal.  Doppler:  Transvalvular velocity was within the normal range.  Trivial regurgitation.  ------------------------------------------------------------  Right atrium:  The atrium was mildly dilated.  ------------------------------------------------------------ Pericardium:  Pleural effusion with stranding noted. There was no pericardial effusion.  ------------------------------------------------------------ Systemic veins:  IVC mildly dilated with normal respirophasic response (estimated CVP of 8  mmHg).  Past Medical History  Diagnosis Date  . ESRD (end stage renal disease)     etiology unknown, history of dialysis  . Shingles     October 2014  . Diabetes mellitus without complication    Past Surgical History  Procedure Laterality Date  . Kidney transplant      about 2008  . Nephrectomy transplanted organ    . Dialysis fistula creation Left   . Prostetic eye Left     cornea damage  . Left arm fracture    . Cholecystectomy    . Abdominal hysterectomy     Current Outpatient Prescriptions on File Prior to Visit  Medication Sig Dispense Refill  . ALPRAZolam (XANAX) 1 MG tablet Take 1 tablet (1 mg total) by mouth at bedtime.  30 tablet  0  . amLODipine (NORVASC) 10 MG tablet Take 10 mg by mouth daily.      Marland Kitchen amoxicillin-clavulanate (AUGMENTIN) 875-125 MG per tablet Take 1 tablet by mouth every 12 (twelve) hours.  6 tablet  0  . calcitRIOL (ROCALTROL) 0.25 MCG capsule Take 0.25 mcg by mouth every Monday, Wednesday, and Friday.      . calcium carbonate (OS-CAL) 600 MG TABS tablet Take 600 mg by mouth 2 (two) times daily with a meal.      . diphenhydramine-acetaminophen (TYLENOL PM) 25-500 MG TABS Take 2 tablets by mouth at bedtime as needed (For sleep).      . furosemide (LASIX) 40 MG tablet Take 80 mg by mouth.      . gabapentin (NEURONTIN) 100 MG capsule Take 100 mg by mouth at bedtime.      Marland Kitchen glimepiride (AMARYL) 1 MG tablet Take 1 mg by mouth daily with supper.      . metoprolol (LOPRESSOR) 50 MG tablet Take 75 mg by mouth 2 (two) times daily.      . Multiple Vitamin (MULTIVITAMIN) tablet Take 1 tablet by mouth daily.      . sirolimus (RAPAMUNE) 1 MG tablet Take 1 mg by mouth daily.      . tacrolimus (PROGRAF) 0.5 MG capsule Take 0.5 mg by mouth 2 (two) times daily.      . tacrolimus (PROGRAF) 1 MG capsule Take 1 mg by mouth 2 (two) times daily.       No current facility-administered medications on file prior to visit.   Social: The patient lives at home with her  husband in [place]. No stairs. Uses a walker. As noted multiple falls. Very limited exercise tolerance.  reports that she has never smoked. She does not have any smokeless tobacco history on file. She reports that she does not drink alcohol or use illicit drugs.  family history includes Cancer in her father.  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 somewhat frail woman but in no distress Vitals; temperature is 97.7 pulse 81 respirations 20 blood pressure 106/42 O2 sat 97% on room air Respiratory clear entry bilaterally no crackles  or wheezes Cardiac extension weight at first sound 2/6 pansystolic murmur maximal at the left lower sternal border. JVP is elevated at 45 Abdomen; no liver no spleen no tenderness GU bladder is not enlarged no costovertebral angle tenderness Extremities venous stasis with minimal edema. Osteoarthritis of both knee Skin there is hyperkeratotic areas on the lateral aspect of her left nose, 2 areas on the right lower leg on the anterolateral aspect and at the dorsal aspect of her fourth and fifth toes. All of these should probably see dermatology. Neurologic 3+ out of 5 strength in the hip flexors and abductors. Reflexes absent at the knee and ankle jerks. She can bring herself to a standing position however her gait is wide-based unsteady  Impression/plan #1 diastolic congestive heart failure with left ventricular hypertrophy. This appears to be very stable right now I will followup on her lab work on Lasix 80 mg a day #2 chronic renal insufficiency in the setting of a renal transplant in 2008 on antirejection medications  but not prednisone.  3 on Augmentin which should be completing in a week. This was for pneumonia and/or Klebsiella UTI. #4 profound lower extremity weakness with probable diabetic neuropathy. Needs to be checked for diabetic autonomic neuropathy as well in the face of somebody who are currently falls #5 hypertension we'll need to monitor while she is here. #6 history of a lower extremity DVT a Doppler done on the index hospitalization showed no need for active treatment for a chronic DVT  The patient's cardiorespiratory status appears to be very stable. However she has profound lower extremity weakness, possible peripheral neuropathy related to her diabetes. She will need aggressive physical and occupational therapy. We'll start a monitoring of her postural drop in her blood pressure

## 2013-01-25 LAB — GLUCOSE, CAPILLARY
GLUCOSE-CAPILLARY: 169 mg/dL — AB (ref 70–99)
GLUCOSE-CAPILLARY: 197 mg/dL — AB (ref 70–99)
Glucose-Capillary: 283 mg/dL — ABNORMAL HIGH (ref 70–99)
Glucose-Capillary: 187 mg/dL — ABNORMAL HIGH (ref 70–99)

## 2013-01-26 ENCOUNTER — Non-Acute Institutional Stay (SKILLED_NURSING_FACILITY): Payer: Medicare PPO | Admitting: Internal Medicine

## 2013-01-26 DIAGNOSIS — R269 Unspecified abnormalities of gait and mobility: Secondary | ICD-10-CM

## 2013-01-26 DIAGNOSIS — I5030 Unspecified diastolic (congestive) heart failure: Secondary | ICD-10-CM

## 2013-01-26 LAB — GLUCOSE, CAPILLARY
GLUCOSE-CAPILLARY: 220 mg/dL — AB (ref 70–99)
Glucose-Capillary: 180 mg/dL — ABNORMAL HIGH (ref 70–99)
Glucose-Capillary: 219 mg/dL — ABNORMAL HIGH (ref 70–99)
Glucose-Capillary: 242 mg/dL — ABNORMAL HIGH (ref 70–99)

## 2013-01-26 NOTE — Progress Notes (Signed)
Patient ID: [name], female   DOB: 03-25-1930, 80 y.o.   MRN: [id number] [facility] Chief complaint, followup but diastolic congestive heart failure.  History this is an 80 year old woman with end-stage renal disease status post a kidney transplant in 2008. She was admitted to hospital with increasing lower extremity edema shortness of breath. She also had sepsis secondary to UTI. She was felt to have subacute delirium. She was treated with empiric diuretics and IV antibiotics for possible pneumonia and a UTI. Urine culture showed Klebsiella blood cultures were negative echocardiogram suggested diastolic congestive heart failure with large bilateral pleural effusions  When I admitted her 48 hours ago I noted profound lower extremity weakness bilaterally especially in hip flexors and abductors. That's the major issue medically that I saw that we'll [place] set necessitated her as spending time in the facility. She had apparently expressed some wish to be discharged home where she lives with her husband.  Physical examination Respiratory clear entry bilaterally Cardiac heart sounds are normal regular, soft systolic murmur and no signs of congestive heart failure  Impression/plan #1 diastolic congestive heart this appears to be stable #2 chronic renal insufficiency status post renal transplant she is on both tacrolimus at 1.5 mg twice a day and sirolimus 1 mg daily. The consultants pharmacist as pointed out that these medications are in the same class of see if I can track this down easily #2 type 2 diabetes on Amaryl    I reviewed her lab work her white count is 3.2 differential count is normal BUN 21 creatinine 1.05 her albumin is low at 2.8. At the patient actually looks fairly good today I think she is going to need further rehabilitation for significant proximal lower strum the weakness which is probably a combination of disuse and diabetic neuropathy

## 2013-01-27 LAB — GLUCOSE, CAPILLARY
Glucose-Capillary: 187 mg/dL — ABNORMAL HIGH (ref 70–99)
Glucose-Capillary: 159 mg/dL — ABNORMAL HIGH (ref 70–99)
Glucose-Capillary: 203 mg/dL — ABNORMAL HIGH (ref 70–99)

## 2013-01-28 LAB — GLUCOSE, CAPILLARY
Glucose-Capillary: 202 mg/dL — ABNORMAL HIGH (ref 70–99)
Glucose-Capillary: 166 mg/dL — ABNORMAL HIGH (ref 70–99)

## 2013-01-29 LAB — GLUCOSE, CAPILLARY
GLUCOSE-CAPILLARY: 164 mg/dL — AB (ref 70–99)
Glucose-Capillary: 219 mg/dL — ABNORMAL HIGH (ref 70–99)
Glucose-Capillary: 195 mg/dL — ABNORMAL HIGH (ref 70–99)
Glucose-Capillary: 266 mg/dL — ABNORMAL HIGH (ref 70–99)

## 2013-01-30 LAB — GLUCOSE, CAPILLARY
Glucose-Capillary: 211 mg/dL — ABNORMAL HIGH (ref 70–99)
Glucose-Capillary: 166 mg/dL — ABNORMAL HIGH (ref 70–99)
Glucose-Capillary: 173 mg/dL — ABNORMAL HIGH (ref 70–99)

## 2013-01-31 ENCOUNTER — Non-Acute Institutional Stay (SKILLED_NURSING_FACILITY): Payer: Medicare PPO | Admitting: Internal Medicine

## 2013-01-31 ENCOUNTER — Other Ambulatory Visit: Payer: Self-pay

## 2013-01-31 ENCOUNTER — Ambulatory Visit (HOSPITAL_COMMUNITY): Payer: Medicare PPO

## 2013-01-31 ENCOUNTER — Ambulatory Visit (HOSPITAL_COMMUNITY)
Admit: 2013-01-31 | Discharge: 2013-01-31 | Disposition: A | Discharge status: Home / Self Care | Payer: Medicare PPO | Source: Ambulatory Visit | Attending: Internal Medicine | Admitting: Internal Medicine

## 2013-01-31 ENCOUNTER — Emergency Department (HOSPITAL_COMMUNITY): Payer: Medicare PPO

## 2013-01-31 ENCOUNTER — Encounter (HOSPITAL_COMMUNITY): Payer: Self-pay | Admitting: Emergency Medicine

## 2013-01-31 ENCOUNTER — Inpatient Hospital Stay (HOSPITAL_COMMUNITY)
Admission: EM | Admit: 2013-01-31 | Discharge: 2013-02-07 | DRG: 871 | Disposition: A | Discharge status: Hospice | Payer: Medicare PPO | Attending: Internal Medicine | Admitting: Internal Medicine

## 2013-01-31 DIAGNOSIS — G9340 Encephalopathy, unspecified: Secondary | ICD-10-CM

## 2013-01-31 DIAGNOSIS — N390 Urinary tract infection, site not specified: Secondary | ICD-10-CM | POA: Diagnosis present

## 2013-01-31 DIAGNOSIS — S42309A Unspecified fracture of shaft of humerus, unspecified arm, initial encounter for closed fracture: Secondary | ICD-10-CM

## 2013-01-31 DIAGNOSIS — J9620 Acute and chronic respiratory failure, unspecified whether with hypoxia or hypercapnia: Secondary | ICD-10-CM | POA: Diagnosis present

## 2013-01-31 DIAGNOSIS — M79609 Pain in unspecified limb: Secondary | ICD-10-CM

## 2013-01-31 DIAGNOSIS — K5900 Constipation, unspecified: Secondary | ICD-10-CM

## 2013-01-31 DIAGNOSIS — I10 Essential (primary) hypertension: Secondary | ICD-10-CM | POA: Diagnosis present

## 2013-01-31 DIAGNOSIS — I82409 Acute embolism and thrombosis of unspecified deep veins of unspecified lower extremity: Secondary | ICD-10-CM

## 2013-01-31 DIAGNOSIS — I4891 Unspecified atrial fibrillation: Secondary | ICD-10-CM | POA: Diagnosis present

## 2013-01-31 DIAGNOSIS — E119 Type 2 diabetes mellitus without complications: Secondary | ICD-10-CM | POA: Diagnosis present

## 2013-01-31 DIAGNOSIS — D126 Benign neoplasm of colon, unspecified: Secondary | ICD-10-CM

## 2013-01-31 DIAGNOSIS — N19 Unspecified kidney failure: Secondary | ICD-10-CM

## 2013-01-31 DIAGNOSIS — Z515 Encounter for palliative care: Secondary | ICD-10-CM

## 2013-01-31 DIAGNOSIS — M6530 Trigger finger, unspecified finger: Secondary | ICD-10-CM

## 2013-01-31 DIAGNOSIS — I743 Embolism and thrombosis of arteries of the lower extremities: Secondary | ICD-10-CM

## 2013-01-31 DIAGNOSIS — I82509 Chronic embolism and thrombosis of unspecified deep veins of unspecified lower extremity: Secondary | ICD-10-CM | POA: Diagnosis present

## 2013-01-31 DIAGNOSIS — I509 Heart failure, unspecified: Secondary | ICD-10-CM | POA: Diagnosis present

## 2013-01-31 DIAGNOSIS — K3184 Gastroparesis: Secondary | ICD-10-CM

## 2013-01-31 DIAGNOSIS — J9601 Acute respiratory failure with hypoxia: Secondary | ICD-10-CM

## 2013-01-31 DIAGNOSIS — Z85828 Personal history of other malignant neoplasm of skin: Secondary | ICD-10-CM

## 2013-01-31 DIAGNOSIS — Z8719 Personal history of other diseases of the digestive system: Secondary | ICD-10-CM

## 2013-01-31 DIAGNOSIS — I5033 Acute on chronic diastolic (congestive) heart failure: Secondary | ICD-10-CM

## 2013-01-31 DIAGNOSIS — D709 Neutropenia, unspecified: Secondary | ICD-10-CM | POA: Diagnosis present

## 2013-01-31 DIAGNOSIS — I824Y9 Acute embolism and thrombosis of unspecified deep veins of unspecified proximal lower extremity: Secondary | ICD-10-CM | POA: Diagnosis present

## 2013-01-31 DIAGNOSIS — A419 Sepsis, unspecified organism: Principal | ICD-10-CM | POA: Diagnosis present

## 2013-01-31 DIAGNOSIS — Z940 Kidney transplant status: Secondary | ICD-10-CM

## 2013-01-31 DIAGNOSIS — Z8679 Personal history of other diseases of the circulatory system: Secondary | ICD-10-CM

## 2013-01-31 DIAGNOSIS — Z8611 Personal history of tuberculosis: Secondary | ICD-10-CM

## 2013-01-31 DIAGNOSIS — K219 Gastro-esophageal reflux disease without esophagitis: Secondary | ICD-10-CM

## 2013-01-31 DIAGNOSIS — Z66 Do not resuscitate: Secondary | ICD-10-CM | POA: Diagnosis present

## 2013-01-31 DIAGNOSIS — I5030 Unspecified diastolic (congestive) heart failure: Secondary | ICD-10-CM

## 2013-01-31 DIAGNOSIS — I5031 Acute diastolic (congestive) heart failure: Secondary | ICD-10-CM | POA: Diagnosis present

## 2013-01-31 DIAGNOSIS — J189 Pneumonia, unspecified organism: Secondary | ICD-10-CM | POA: Diagnosis present

## 2013-01-31 DIAGNOSIS — B961 Klebsiella pneumoniae [K. pneumoniae] as the cause of diseases classified elsewhere: Secondary | ICD-10-CM | POA: Diagnosis present

## 2013-01-31 LAB — URINALYSIS, ROUTINE W REFLEX MICROSCOPIC
BILIRUBIN URINE: NEGATIVE
Glucose, UA: NEGATIVE mg/dL
KETONES UR: NEGATIVE mg/dL
NITRITE: POSITIVE — AB
PROTEIN: 100 mg/dL — AB
Specific Gravity, Urine: 1.015 (ref 1.005–1.030)
Urobilinogen, UA: 0.2 mg/dL (ref 0.0–1.0)
pH: 5.5 (ref 5.0–8.0)

## 2013-01-31 LAB — CBC WITH DIFFERENTIAL/PLATELET
BASOS ABS: 0 10*3/uL (ref 0.0–0.1)
Basophils Relative: 0 % (ref 0–1)
Eosinophils Absolute: 0 10*3/uL (ref 0.0–0.7)
Eosinophils Relative: 1 % (ref 0–5)
HEMATOCRIT: 35.1 % — AB (ref 36.0–46.0)
Hemoglobin: 11.5 g/dL — ABNORMAL LOW (ref 12.0–15.0)
Lymphocytes Relative: 13 % (ref 12–46)
Lymphs Abs: 0.5 10*3/uL — ABNORMAL LOW (ref 0.7–4.0)
MCH: 27.4 pg (ref 26.0–34.0)
MCHC: 32.8 g/dL (ref 30.0–36.0)
MCV: 83.6 fL (ref 78.0–100.0)
Monocytes Absolute: 0.2 10*3/uL (ref 0.1–1.0)
Monocytes Relative: 5 % (ref 3–12)
Neutro Abs: 3 10*3/uL (ref 1.7–7.7)
Neutrophils Relative %: 82 % — ABNORMAL HIGH (ref 43–77)
Platelets: 203 10*3/uL (ref 150–400)
RBC: 4.2 MIL/uL (ref 3.87–5.11)
RDW: 15.2 % (ref 11.5–15.5)
WBC: 3.7 10*3/uL — ABNORMAL LOW (ref 4.0–10.5)

## 2013-01-31 LAB — COMPREHENSIVE METABOLIC PANEL
ALBUMIN: 3.3 g/dL — AB (ref 3.5–5.2)
ALT: 10 U/L (ref 0–35)
AST: 17 U/L (ref 0–37)
Alkaline Phosphatase: 84 U/L (ref 39–117)
BUN: 25 mg/dL — ABNORMAL HIGH (ref 6–23)
CALCIUM: 10.1 mg/dL (ref 8.4–10.5)
CO2: 28 meq/L (ref 19–32)
CREATININE: 1.08 mg/dL (ref 0.50–1.10)
Chloride: 95 mEq/L — ABNORMAL LOW (ref 96–112)
GFR calc Af Amer: 55 mL/min — ABNORMAL LOW (ref >90)
GFR, EST NON AFRICAN AMERICAN: 47 mL/min — AB (ref >90)
Glucose, Bld: 236 mg/dL — ABNORMAL HIGH (ref 70–99)
Potassium: 4.1 mEq/L (ref 3.7–5.3)
Sodium: 137 mEq/L (ref 137–147)
Total Bilirubin: 0.6 mg/dL (ref 0.3–1.2)
Total Protein: 7.7 g/dL (ref 6.0–8.3)

## 2013-01-31 LAB — D-DIMER, QUANTITATIVE: D-Dimer, Quant: 2.83 ug/mL-FEU — ABNORMAL HIGH (ref 0.00–0.48)

## 2013-01-31 LAB — GLUCOSE, CAPILLARY
Glucose-Capillary: 239 mg/dL — ABNORMAL HIGH (ref 70–99)
Glucose-Capillary: 168 mg/dL — ABNORMAL HIGH (ref 70–99)

## 2013-01-31 LAB — TROPONIN I

## 2013-01-31 LAB — PRO B NATRIURETIC PEPTIDE: Pro B Natriuretic peptide (BNP): 12081 pg/mL — ABNORMAL HIGH (ref 0–450)

## 2013-01-31 LAB — URINE MICROSCOPIC-ADD ON

## 2013-01-31 MED ORDER — ACETAMINOPHEN 325 MG PO TABS
ORAL_TABLET | ORAL | Status: AC
  Filled 2013-01-31: qty 2

## 2013-01-31 MED ORDER — ALBUTEROL SULFATE (2.5 MG/3ML) 0.083% IN NEBU
2.5000 mg | INHALATION_SOLUTION | Freq: Once | RESPIRATORY_TRACT | Status: AC
  Administered 2013-01-31: 2.5 mg via RESPIRATORY_TRACT
  Filled 2013-01-31: qty 3

## 2013-01-31 MED ORDER — DILTIAZEM HCL 25 MG/5ML IV SOLN
INTRAVENOUS | Status: AC
Start: 2013-01-31 — End: 2013-02-01
  Filled 2013-01-31: qty 5

## 2013-01-31 MED ORDER — CIPROFLOXACIN IN D5W 400 MG/200ML IV SOLN
400.0000 mg | Freq: Once | INTRAVENOUS | Status: AC
  Administered 2013-01-31: 400 mg via INTRAVENOUS
  Filled 2013-01-31: qty 200

## 2013-01-31 MED ORDER — ACETAMINOPHEN 325 MG PO TABS
650.0000 mg | ORAL_TABLET | Freq: Once | ORAL | Status: AC
  Administered 2013-01-31: 650 mg via ORAL

## 2013-01-31 MED ORDER — IPRATROPIUM-ALBUTEROL 0.5-2.5 (3) MG/3ML IN SOLN
3.0000 mL | Freq: Once | RESPIRATORY_TRACT | Status: AC
  Administered 2013-01-31: 3 mL via RESPIRATORY_TRACT
  Filled 2013-01-31: qty 3

## 2013-01-31 MED ORDER — IOHEXOL 350 MG/ML SOLN
80.0000 mL | Freq: Once | INTRAVENOUS | Status: AC | PRN
  Administered 2013-01-31: 80 mL via INTRAVENOUS

## 2013-01-31 MED ORDER — IPRATROPIUM BROMIDE 0.02 % IN SOLN: 0.5000 mg | Freq: Once | RESPIRATORY_TRACT | Status: DC

## 2013-01-31 MED ORDER — ALBUTEROL SULFATE (2.5 MG/3ML) 0.083% IN NEBU: 5.0000 mg | INHALATION_SOLUTION | Freq: Once | RESPIRATORY_TRACT | Status: DC

## 2013-01-31 MED ORDER — DILTIAZEM HCL 25 MG/5ML IV SOLN
10.0000 mg | Freq: Once | INTRAVENOUS | Status: AC
  Administered 2013-01-31: 10 mg via INTRAVENOUS

## 2013-01-31 MED ORDER — METHYLPREDNISOLONE SODIUM SUCC 125 MG IJ SOLR
125.0000 mg | Freq: Once | INTRAMUSCULAR | Status: AC
  Administered 2013-01-31: 125 mg via INTRAVENOUS
  Filled 2013-01-31: qty 2

## 2013-01-31 MED ORDER — DILTIAZEM HCL 100 MG IV SOLR
5.0000 mg/h | INTRAVENOUS | Status: DC
  Administered 2013-01-31: 5 mg/h via INTRAVENOUS
  Filled 2013-01-31: qty 100

## 2013-01-31 MED ORDER — SODIUM CHLORIDE 0.9 % IV BOLUS (SEPSIS)
250.0000 mL | Freq: Once | INTRAVENOUS | Status: AC
  Administered 2013-01-31: 250 mL via INTRAVENOUS

## 2013-01-31 MED ORDER — ACETAMINOPHEN 325 MG PO TABS
650.0000 mg | ORAL_TABLET | Freq: Once | ORAL | Status: AC
  Administered 2013-01-31: 650 mg via ORAL
  Filled 2013-01-31: qty 2

## 2013-01-31 NOTE — ED Provider Notes (Addendum)
CSN: [id number]     Arrival date & time 01/31/13  1724 History  This chart was scribed for Maudry Diego, MD by Adriana Reams, ED Scribe. This patient was seen in room APA07/APA07 and the patient's care was started at 1747.    First MD Initiated Contact with Patient 01/31/13 1747     Chief Complaint  Patient presents with  . Shortness of Breath    Patient is a 80 y.o. female presenting with shortness of breath. The history is provided by the patient and the nursing home. No language interpreter was used.  Shortness of Breath Severity:  Moderate Onset quality:  Gradual Duration:  1 day Timing:  Intermittent Progression:  Unchanged Chronicity:  New Relieved by:  Nothing Worsened by:  Nothing tried Ineffective treatments:  Oxygen Associated symptoms: wheezing   Associated symptoms: no abdominal pain, no chest pain, no cough, no headaches and no rash   Risk factors: hx of PE/DVT    HPI Comments: [name] is a 80 y.o. female with hx of DVT, who presents to the Emergency Department from her nursing home complaining of SOB. Her nursing home states she had an episode of hypoxia today with saturation in the 80s. She has a hx of getting dialysis but states it stopped when she had a kidney transplant. Her nursing home is requesting a CT to rule out a PE. She states she is not normally on oxygen.   Past Medical History  Diagnosis Date  . ESRD (end stage renal disease)     etiology unknown, history of dialysis  . Shingles     October 2014  . Diabetes mellitus without complication    Past Surgical History  Procedure Laterality Date  . Kidney transplant      about 2008  . Nephrectomy transplanted organ    . Dialysis fistula creation Left   . Prostetic eye Left     cornea damage  . Left arm fracture    . Cholecystectomy    . Abdominal hysterectomy     Family History  Problem Relation Age of Onset  . Cancer Father     colon   History  Substance Use Topics  . Smoking  status: Never Smoker   . Smokeless tobacco: Not on file  . Alcohol Use: No   OB History   Grav Para Term Preterm Abortions TAB SAB Ect Mult Living                 Review of Systems  Constitutional: Negative for appetite change and fatigue.  HENT: Negative for congestion, ear discharge and sinus pressure.   Eyes: Negative for discharge.  Respiratory: Positive for shortness of breath and wheezing. Negative for cough.   Cardiovascular: Positive for leg swelling. Negative for chest pain.  Gastrointestinal: Negative for abdominal pain and diarrhea.  Genitourinary: Negative for frequency and hematuria.  Musculoskeletal: Negative for back pain.  Skin: Negative for rash.  Neurological: Negative for seizures and headaches.  Psychiatric/Behavioral: Negative for hallucinations.    Allergies  Codeine and Penicillins  Home Medications   Current Outpatient Rx  Name  Route  Sig  Dispense  Refill  . ALPRAZolam (XANAX) 1 MG tablet   Oral   Take 1 tablet (1 mg total) by mouth at bedtime.   30 tablet   0   . amLODipine (NORVASC) 10 MG tablet   Oral   Take 10 mg by mouth daily.         Marland Kitchen amoxicillin-clavulanate (  AUGMENTIN) 875-125 MG per tablet   Oral   Take 1 tablet by mouth every 12 (twelve) hours.   6 tablet   0   . calcitRIOL (ROCALTROL) 0.25 MCG capsule   Oral   Take 0.25 mcg by mouth every Monday, Wednesday, and Friday.         . calcium carbonate (OS-CAL) 600 MG TABS tablet   Oral   Take 600 mg by mouth 2 (two) times daily with a meal.         . diphenhydramine-acetaminophen (TYLENOL PM) 25-500 MG TABS   Oral   Take 2 tablets by mouth at bedtime as needed (For sleep).         . furosemide (LASIX) 40 MG tablet   Oral   Take 80 mg by mouth.         . gabapentin (NEURONTIN) 100 MG capsule   Oral   Take 100 mg by mouth at bedtime.         Marland Kitchen glimepiride (AMARYL) 1 MG tablet   Oral   Take 1 mg by mouth daily with supper.         . metoprolol  (LOPRESSOR) 50 MG tablet   Oral   Take 75 mg by mouth 2 (two) times daily.         . Multiple Vitamin (MULTIVITAMIN) tablet   Oral   Take 1 tablet by mouth daily.         . sirolimus (RAPAMUNE) 1 MG tablet   Oral   Take 1 mg by mouth daily.         . tacrolimus (PROGRAF) 0.5 MG capsule   Oral   Take 0.5 mg by mouth 2 (two) times daily.         . tacrolimus (PROGRAF) 1 MG capsule   Oral   Take 1 mg by mouth 2 (two) times daily.          Triage Vitalss; BP 159/60  Pulse 104  Temp(Src) 99.7 F (37.6 C) (Oral)  Resp 18  SpO2 100%   Physical Exam  Constitutional: She is oriented to person, place, and time. She appears well-developed.  HENT:  Head: Normocephalic.  Eyes: Conjunctivae and EOM are normal. No scleral icterus.  Neck: Neck supple. No thyromegaly present.  Cardiovascular: Regular rhythm.  Tachycardia present.  Exam reveals no gallop and no friction rub.   No murmur heard. Rapid regular heart beat.   Pulmonary/Chest: No stridor. She has wheezes. She has no rales. She exhibits no tenderness.  Mild wheezing bilaterally  Abdominal: She exhibits no distension. There is no tenderness. There is no rebound.  Musculoskeletal: Normal range of motion. She exhibits edema.  Old shunt in left forearm. 2+ edema in ankles bilaterally.   Lymphadenopathy:    She has no cervical adenopathy.  Neurological: She is oriented to person, place, and time. She exhibits normal muscle tone. Coordination normal.  Skin: No rash noted. No erythema.  Psychiatric: She has a normal mood and affect. Her behavior is normal.    ED Course  Procedures (including critical care time) DIAGNOSTIC STUDIES: Oxygen Saturation is 100% on RA, normal by my interpretation.    COORDINATION OF CARE: 6:01 PM Discussed treatment plan which includes Cipro, Tylenol, Solu-Medrol, Duoneb EKG, .083% nebulizer solution 5 mg Proventil, .5mg  Atrovent nebulizer solution, CXR, CBC with differential, and  comprehensive metabolic panel with pt at bedside and pt agreed to plan.   10:44 PM Rechecked pt. Pt's daughter states she has never  had heart problems up until 3 weeks ago, when she was hospitalized in the ICU for CHF and had fluid drawn off of her heart. Will order 10 mg Cardizem.  Medications  diltiazem (CARDIZEM) injection 10 mg (not administered)  methylPREDNISolone sodium succinate (SOLU-MEDROL) 125 mg/2 mL injection 125 mg (125 mg Intravenous Given 01/31/13 1826)  ipratropium-albuterol (DUONEB) 0.5-2.5 (3) MG/3ML nebulizer solution 3 mL (3 mLs Nebulization Given 01/31/13 1819)  albuterol (PROVENTIL) (2.5 MG/3ML) 0.083% nebulizer solution 2.5 mg (2.5 mg Nebulization Given 01/31/13 1819)  acetaminophen (TYLENOL) tablet 650 mg (650 mg Oral Given 01/31/13 2004)  ciprofloxacin (CIPRO) IVPB 400 mg (0 mg Intravenous Stopped 01/31/13 2225)  iohexol (OMNIPAQUE) 350 MG/ML injection 80 mL (80 mLs Intravenous Contrast Given 01/31/13 2215)      Labs Review Labs Reviewed  CBC WITH DIFFERENTIAL - Abnormal; Notable for the following:    WBC 3.7 (*)    Hemoglobin 11.5 (*)    HCT 35.1 (*)    Neutrophils Relative % 82 (*)    Lymphs Abs 0.5 (*)    All other components within normal limits  COMPREHENSIVE METABOLIC PANEL - Abnormal; Notable for the following:    Chloride 95 (*)    Glucose, Bld 236 (*)    BUN 25 (*)    Albumin 3.3 (*)    GFR calc non Af Amer 47 (*)    GFR calc Af Amer 55 (*)    All other components within normal limits  URINALYSIS, ROUTINE W REFLEX MICROSCOPIC - Abnormal; Notable for the following:    APPearance CLOUDY (*)    Hgb urine dipstick SMALL (*)    Protein, ur 100 (*)    Nitrite POSITIVE (*)    Leukocytes, UA TRACE (*)    All other components within normal limits  D-DIMER, QUANTITATIVE - Abnormal; Notable for the following:    D-Dimer, Quant 2.83 (*)    All other components within normal limits  URINE MICROSCOPIC-ADD ON - Abnormal; Notable for the following:     Bacteria, UA MANY (*)    All other components within normal limits  URINE CULTURE   Imaging Review Dg Chest 1 View  01/31/2013   CLINICAL DATA:  Shortness of breath, CHF  EXAM: CHEST - 1 VIEW  COMPARISON:  01/17/2013  FINDINGS: Rotated to the left.  Enlargement of cardiac silhouette.  Atherosclerotic calcification aortic arch.  Slight pulmonary vascular congestion.  Interstitial infiltrates likely pulmonary edema, is improved from previous exam.  Increased bibasilar atelectasis and again noted small bibasilar effusions.  No pneumothorax.  Bones demineralized with orthopedic hardware at the proximal left humerus.  IMPRESSION: Minimal CHF improved from previous exam.  Bibasilar atelectasis and small pleural effusions.   Electronically Signed   By: Lavonia Dana M.D.   On: 01/31/2013 17:21   US Venous Img Lower Unilateral Right  01/31/2013   CLINICAL DATA:  Right lower extremity pain.  EXAM: RIGHT LOWER EXTREMITY VENOUS DOPPLER ULTRASOUND  TECHNIQUE: Gray-scale sonography with graded compression, as well as color Doppler and duplex ultrasound, were performed to evaluate the deep venous system from the level of the common femoral vein through the popliteal and proximal calf veins. Spectral Doppler was utilized to evaluate flow at rest and with distal augmentation maneuvers.  COMPARISON:  US VENOUS IMG LOWER  BILATERAL dated 01/15/2013  FINDINGS: Thrombus within deep veins: Nonocclusive thrombus is again identified in the femoral vein at the level of the mid to distal thigh. There does appear to be some progression since  the prior ultrasound study with some nonocclusive thrombus now also evident in the popliteal vein, which is new.  Duplex waveform respiratory phasicity:  Normal.  Duplex waveform response to augmentation:  Normal.  Venous reflux:  None visualized.  Other findings: No evidence of superficial thrombophlebitis or abnormal fluid collection.  IMPRESSION: Nonocclusive thrombus in the femoral and  popliteal vein of the right lower extremity. This appears progressive since the prior study on 01/15/2013 and there likely is a component of acute DVT which may have propagated from a segment of more chronic appearing thrombus previously.   Electronically Signed   By: Aletta Edouard M.D.   On: 01/31/2013 15:59    EKG Interpretation   None      The pt went into rapid afib after getting ct chest                  CRITICAL CARE Performed by: Katharina Jehle L Total critical care time: 35 Critical care time was exclusive of separately billable procedures and treating other patients. Critical care was necessary to treat or prevent imminent or life-threatening deterioration. Critical care was time spent personally by me on the following activities: development of treatment plan with patient and/or surrogate as well as nursing, discussions with consultants, evaluation of patient's response to treatment, examination of patient, obtaining history from patient or surrogate, ordering and performing treatments and interventions, ordering and review of laboratory studies, ordering and review of radiographic studies, pulse oximetry and re-evaluation of patient's condition.  MDM  Rapid afib    chf admit  The chart was scribed for me under my direct supervision.  I personally performed the history, physical, and medical decision making and all procedures in the evaluation of this patient.Maudry Diego, MD 01/31/13 [id number]  Maudry Diego, MD 02/07/13 [id number]  Maudry Diego, MD 02/14/13 1537

## 2013-01-31 NOTE — ED Notes (Signed)
Pt's HR 130-140's, pt denies SOB or CP, pt alert and talking with daughter in room, EDP made aware of pt's HR, repeat EKG done and EDP seen

## 2013-01-31 NOTE — ED Notes (Signed)
Patient arrives from [facility]. Dr Dellia Nims sent patient for CT to r/o PE. Episode of hypoxia today with saturation 80s. Currently 100% on 2 L [place]. Patient denies being short of breath, denies chest pain.

## 2013-02-01 DIAGNOSIS — I4891 Unspecified atrial fibrillation: Secondary | ICD-10-CM | POA: Diagnosis present

## 2013-02-01 DIAGNOSIS — I509 Heart failure, unspecified: Secondary | ICD-10-CM

## 2013-02-01 DIAGNOSIS — N390 Urinary tract infection, site not specified: Secondary | ICD-10-CM

## 2013-02-01 DIAGNOSIS — I82409 Acute embolism and thrombosis of unspecified deep veins of unspecified lower extremity: Secondary | ICD-10-CM | POA: Diagnosis present

## 2013-02-01 DIAGNOSIS — E119 Type 2 diabetes mellitus without complications: Secondary | ICD-10-CM | POA: Diagnosis present

## 2013-02-01 DIAGNOSIS — J9600 Acute respiratory failure, unspecified whether with hypoxia or hypercapnia: Secondary | ICD-10-CM

## 2013-02-01 DIAGNOSIS — I5033 Acute on chronic diastolic (congestive) heart failure: Secondary | ICD-10-CM

## 2013-02-01 LAB — COMPREHENSIVE METABOLIC PANEL
ALK PHOS: 70 U/L (ref 39–117)
ALT: 8 U/L (ref 0–35)
AST: 12 U/L (ref 0–37)
Albumin: 2.6 g/dL — ABNORMAL LOW (ref 3.5–5.2)
BUN: 29 mg/dL — ABNORMAL HIGH (ref 6–23)
CALCIUM: 9 mg/dL (ref 8.4–10.5)
CO2: 24 meq/L (ref 19–32)
Chloride: 95 mEq/L — ABNORMAL LOW (ref 96–112)
Creatinine, Ser: 1.17 mg/dL — ABNORMAL HIGH (ref 0.50–1.10)
GFR calc non Af Amer: 43 mL/min — ABNORMAL LOW (ref >90)
GFR, EST AFRICAN AMERICAN: 50 mL/min — AB (ref >90)
GLUCOSE: 407 mg/dL — AB (ref 70–99)
Potassium: 4.3 mEq/L (ref 3.7–5.3)
SODIUM: 136 meq/L — AB (ref 137–147)
Total Bilirubin: 0.4 mg/dL (ref 0.3–1.2)
Total Protein: 6.3 g/dL (ref 6.0–8.3)

## 2013-02-01 LAB — CBC
HCT: 31.5 % — ABNORMAL LOW (ref 36.0–46.0)
Hemoglobin: 10.1 g/dL — ABNORMAL LOW (ref 12.0–15.0)
MCH: 26.8 pg (ref 26.0–34.0)
MCHC: 32.1 g/dL (ref 30.0–36.0)
MCV: 83.6 fL (ref 78.0–100.0)
Platelets: 175 10*3/uL (ref 150–400)
RBC: 3.77 MIL/uL — AB (ref 3.87–5.11)
RDW: 15.2 % (ref 11.5–15.5)
WBC: 1.3 10*3/uL — CL (ref 4.0–10.5)

## 2013-02-01 LAB — GLUCOSE, CAPILLARY
GLUCOSE-CAPILLARY: 180 mg/dL — AB (ref 70–99)
GLUCOSE-CAPILLARY: 169 mg/dL — AB (ref 70–99)
GLUCOSE-CAPILLARY: 402 mg/dL — AB (ref 70–99)
Glucose-Capillary: 265 mg/dL — ABNORMAL HIGH (ref 70–99)
Glucose-Capillary: 193 mg/dL — ABNORMAL HIGH (ref 70–99)

## 2013-02-01 LAB — DIFFERENTIAL
BASOS ABS: 0 10*3/uL (ref 0.0–0.1)
BASOS PCT: 0 % (ref 0–1)
EOS ABS: 0 10*3/uL (ref 0.0–0.7)
Eosinophils Relative: 0 % (ref 0–5)
Lymphocytes Relative: 12 % (ref 12–46)
Lymphs Abs: 0.2 10*3/uL — ABNORMAL LOW (ref 0.7–4.0)
MONO ABS: 0 10*3/uL — AB (ref 0.1–1.0)
Monocytes Relative: 2 % — ABNORMAL LOW (ref 3–12)
NEUTROS PCT: 86 % — AB (ref 43–77)
Neutro Abs: 1.1 10*3/uL — ABNORMAL LOW (ref 1.7–7.7)

## 2013-02-01 LAB — HEPARIN LEVEL (UNFRACTIONATED): Heparin Unfractionated: 0.59 IU/mL (ref 0.30–0.70)

## 2013-02-01 MED ORDER — ALPRAZOLAM 1 MG PO TABS
1.0000 mg | ORAL_TABLET | Freq: Every day | ORAL | Status: DC
  Administered 2013-02-01 – 2013-02-07 (×3): 1 mg via ORAL
  Filled 2013-02-01 (×2): qty 1
  Filled 2013-02-01: qty 2

## 2013-02-01 MED ORDER — VANCOMYCIN HCL 500 MG IV SOLR
500.0000 mg | Freq: Once | INTRAVENOUS | Status: AC
  Administered 2013-02-01: 500 mg via INTRAVENOUS
  Filled 2013-02-01: qty 500

## 2013-02-01 MED ORDER — FUROSEMIDE 80 MG PO TABS: 80.0000 mg | ORAL_TABLET | Freq: Every day | ORAL | Status: DC

## 2013-02-01 MED ORDER — ONDANSETRON HCL 4 MG/2ML IJ SOLN: 4.0000 mg | Freq: Four times a day (QID) | INTRAMUSCULAR | Status: DC | PRN

## 2013-02-01 MED ORDER — WARFARIN - PHARMACIST DOSING INPATIENT
Status: DC
  Administered 2013-02-05: 16:00:00

## 2013-02-01 MED ORDER — HEPARIN (PORCINE) IN NACL 100-0.45 UNIT/ML-% IJ SOLN
1250.0000 [IU]/h | INTRAMUSCULAR | Status: DC
  Administered 2013-02-01 – 2013-02-02 (×2): 1100 [IU]/h via INTRAVENOUS
  Administered 2013-02-02: 1250 [IU]/h via INTRAVENOUS
  Filled 2013-02-01 (×3): qty 250

## 2013-02-01 MED ORDER — HEPARIN BOLUS VIA INFUSION
4000.0000 [IU] | Freq: Once | INTRAVENOUS | Status: AC
  Administered 2013-02-01: 4000 [IU] via INTRAVENOUS
  Filled 2013-02-01: qty 4000

## 2013-02-01 MED ORDER — SODIUM CHLORIDE 0.9 % IJ SOLN
3.0000 mL | Freq: Two times a day (BID) | INTRAMUSCULAR | Status: DC
  Administered 2013-02-01 – 2013-02-05 (×6): 3 mL via INTRAVENOUS

## 2013-02-01 MED ORDER — CALCIUM CARBONATE 600 MG PO TABS
600.0000 mg | ORAL_TABLET | Freq: Two times a day (BID) | ORAL | Status: DC
  Filled 2013-02-01 (×3): qty 1

## 2013-02-01 MED ORDER — SODIUM CHLORIDE 0.9 % IJ SOLN: 3.0000 mL | INTRAMUSCULAR | Status: DC | PRN

## 2013-02-01 MED ORDER — TACROLIMUS 1 MG PO CAPS
ORAL_CAPSULE | ORAL | Status: AC
  Filled 2013-02-01: qty 1

## 2013-02-01 MED ORDER — DIGOXIN 125 MCG PO TABS
0.1250 mg | ORAL_TABLET | Freq: Every day | ORAL | Status: DC
  Administered 2013-02-02 – 2013-02-03 (×2): 0.125 mg via ORAL
  Filled 2013-02-01 (×2): qty 1

## 2013-02-01 MED ORDER — INSULIN ASPART 100 UNIT/ML ~~LOC~~ SOLN
0.0000 [IU] | Freq: Three times a day (TID) | SUBCUTANEOUS | Status: DC
  Administered 2013-02-01: 5 [IU] via SUBCUTANEOUS
  Administered 2013-02-01: 9 [IU] via SUBCUTANEOUS
  Administered 2013-02-02 (×2): 3 [IU] via SUBCUTANEOUS

## 2013-02-01 MED ORDER — ONDANSETRON HCL 4 MG PO TABS: 4.0000 mg | ORAL_TABLET | Freq: Four times a day (QID) | ORAL | Status: DC | PRN

## 2013-02-01 MED ORDER — TACROLIMUS 1 MG PO CAPS
1.0000 mg | ORAL_CAPSULE | Freq: Two times a day (BID) | ORAL | Status: DC
  Administered 2013-02-01: 1 mg via ORAL
  Filled 2013-02-01 (×4): qty 1

## 2013-02-01 MED ORDER — DIGOXIN 0.25 MG/ML IJ SOLN
0.2500 mg | Freq: Once | INTRAMUSCULAR | Status: AC
  Filled 2013-02-01: qty 2

## 2013-02-01 MED ORDER — CALCIUM CARBONATE 1250 (500 CA) MG PO TABS
1.0000 | ORAL_TABLET | Freq: Two times a day (BID) | ORAL | Status: DC
  Administered 2013-02-01 – 2013-02-03 (×5): 500 mg via ORAL
  Filled 2013-02-01 (×6): qty 1

## 2013-02-01 MED ORDER — SODIUM CHLORIDE 0.9 % IV BOLUS (SEPSIS)
500.0000 mL | Freq: Once | INTRAVENOUS | Status: AC
  Administered 2013-02-01: 500 mL via INTRAVENOUS

## 2013-02-01 MED ORDER — FUROSEMIDE 10 MG/ML IJ SOLN
40.0000 mg | Freq: Two times a day (BID) | INTRAMUSCULAR | Status: DC
  Administered 2013-02-01 – 2013-02-02 (×3): 40 mg via INTRAVENOUS
  Filled 2013-02-01 (×3): qty 4

## 2013-02-01 MED ORDER — TACROLIMUS 1 MG PO CAPS
1.5000 mg | ORAL_CAPSULE | Freq: Two times a day (BID) | ORAL | Status: DC
Start: 2013-02-01 — End: 2013-02-07
  Administered 2013-02-01 – 2013-02-03 (×5): 1.5 mg via ORAL
  Filled 2013-02-01 (×17): qty 1

## 2013-02-01 MED ORDER — VANCOMYCIN HCL 500 MG IV SOLR
INTRAVENOUS | Status: AC
  Filled 2013-02-01: qty 500

## 2013-02-01 MED ORDER — SIROLIMUS 1 MG PO TABS
1.0000 mg | ORAL_TABLET | Freq: Every day | ORAL | Status: DC
  Administered 2013-02-01 – 2013-02-03 (×3): 1 mg via ORAL
  Filled 2013-02-01 (×9): qty 1

## 2013-02-01 MED ORDER — CEFEPIME HCL 1 G IJ SOLR
INTRAMUSCULAR | Status: AC
  Filled 2013-02-01: qty 1

## 2013-02-01 MED ORDER — TACROLIMUS 0.5 MG PO CAPS
0.5000 mg | ORAL_CAPSULE | Freq: Two times a day (BID) | ORAL | Status: DC
  Administered 2013-02-01: 0.5 mg via ORAL
  Filled 2013-02-01 (×4): qty 1

## 2013-02-01 MED ORDER — DEXTROSE 5 % IV SOLN
1.0000 g | Freq: Two times a day (BID) | INTRAVENOUS | Status: DC
  Administered 2013-02-01 (×3): 1 g via INTRAVENOUS
  Filled 2013-02-01 (×4): qty 1

## 2013-02-01 MED ORDER — DIGOXIN 0.25 MG/ML IJ SOLN
0.5000 mg | Freq: Once | INTRAMUSCULAR | Status: AC
  Administered 2013-02-01: 0.5 mg via INTRAVENOUS
  Filled 2013-02-01: qty 2

## 2013-02-01 MED ORDER — VANCOMYCIN HCL IN DEXTROSE 1-5 GM/200ML-% IV SOLN
1000.0000 mg | INTRAVENOUS | Status: DC
  Administered 2013-02-01 – 2013-02-04 (×4): 1000 mg via INTRAVENOUS
  Filled 2013-02-01 (×4): qty 200

## 2013-02-01 MED ORDER — GABAPENTIN 100 MG PO CAPS
100.0000 mg | ORAL_CAPSULE | Freq: Every day | ORAL | Status: DC
  Administered 2013-02-01 – 2013-02-02 (×2): 100 mg via ORAL
  Filled 2013-02-01 (×2): qty 1

## 2013-02-01 MED ORDER — METOPROLOL TARTRATE 50 MG PO TABS
75.0000 mg | ORAL_TABLET | Freq: Two times a day (BID) | ORAL | Status: DC
  Administered 2013-02-01 – 2013-02-02 (×4): 75 mg via ORAL
  Filled 2013-02-01 (×9): qty 1

## 2013-02-01 MED ORDER — WARFARIN SODIUM 5 MG PO TABS
5.0000 mg | ORAL_TABLET | Freq: Once | ORAL | Status: AC
  Administered 2013-02-01: 5 mg via ORAL
  Filled 2013-02-01: qty 1

## 2013-02-01 MED ORDER — CALCITRIOL 0.25 MCG PO CAPS
0.2500 ug | ORAL_CAPSULE | ORAL | Status: DC
  Administered 2013-02-01 – 2013-02-02 (×2): 0.25 ug via ORAL
  Filled 2013-02-01 (×2): qty 1

## 2013-02-01 MED ORDER — SODIUM CHLORIDE 0.9 % IV SOLN: 250.0000 mL | INTRAVENOUS | Status: DC | PRN

## 2013-02-01 NOTE — Progress Notes (Signed)
ANTICOAGULATION CONSULT NOTE - Initial Consult  Pharmacy Consult for Heparin Indication: VTE Treatment  Allergies  Allergen Reactions  . Codeine     REACTION: UNKNOWN REACTION  . Penicillins    Patient Measurements: Height: 5\' 8"  (172.7 cm) Weight: 158 lb 11.7 oz (72 kg) IBW/kg (Calculated) : 63.9  Vital Signs: Temp: 97.8 F (36.6 C) (01/27 0500) Temp src: Rectal (01/26 2159) BP: 110/63 mmHg (01/27 0630) Pulse Rate: 130 (01/27 0328)  Labs:  Recent Labs  01/31/13 1825 01/31/13 2314 02/01/13 0424  HGB 11.5*  --  10.1*  HCT 35.1*  --  31.5*  PLT 203  --  175  CREATININE 1.08  --  1.17*  TROPONINI  --  <0.30  --     Estimated Creatinine Clearance: 38.7 ml/min (by C-G formula based on Cr of 1.17).  Medical History: Past Medical History  Diagnosis Date  . ESRD (end stage renal disease)     etiology unknown, history of dialysis  . Shingles     October 2014  . Diabetes mellitus without complication    Medications:  Scheduled:  . ALPRAZolam  1 mg Oral QHS  . [START ON 02/02/2013] calcitRIOL  0.25 mcg Oral Q M,W,F  . calcium carbonate  1 tablet Oral BID WC  . ceFEPime (MAXIPIME) IV  1 g Intravenous Q12H  . furosemide  40 mg Intravenous Q12H  . gabapentin  100 mg Oral QHS  . heparin  4,000 Units Intravenous Once  . metoprolol  75 mg Oral BID  . sirolimus  1 mg Oral Daily  . sodium chloride  3 mL Intravenous Q12H  . tacrolimus  1.5 mg Oral BID  . vancomycin  1,000 mg Intravenous Q24H    Assessment: 80yo female admitted for pneumonia.  [place] of LE's show nonocclusive thrombus of femoral and popliteal vein.  Initiating IV Heparin protocol for DVT.   Goal of Therapy:  Heparin level 0.3-0.7 units/ml Monitor platelets by anticoagulation protocol: Yes   Plan:  Heparin 4000 units IV bolus now x 1 Heparin infusion at 1100 units/hr (~16 units/Kg/hr) Check heparin level in 6-8 hrs then daily Monitor CBC  Orel Hord A 02/01/2013,8:11 AM

## 2013-02-01 NOTE — Progress Notes (Addendum)
ANTICOAGULATION CONSULT NOTE - follow up  Pharmacy Consult for Heparin >> Coumadin Indication: VTE Treatment  Allergies  Allergen Reactions  . Codeine     REACTION: UNKNOWN REACTION  . Penicillins    Patient Measurements: Height: 5\' 8"  (172.7 cm) Weight: 158 lb 11.7 oz (72 kg) IBW/kg (Calculated) : 63.9  Vital Signs: Temp: 98 F (36.7 C) (01/27 0730) Temp src: Axillary (01/27 0730) BP: 123/67 mmHg (01/27 0900) Pulse Rate: 130 (01/27 0328)  Labs:  Recent Labs  01/31/13 1825 01/31/13 2314 02/01/13 0424  HGB 11.5*  --  10.1*  HCT 35.1*  --  31.5*  PLT 203  --  175  CREATININE 1.08  --  1.17*  TROPONINI  --  <0.30  --    Estimated Creatinine Clearance: 38.7 ml/min (by C-G formula based on Cr of 1.17).  Medical History: Past Medical History  Diagnosis Date  . ESRD (end stage renal disease)     etiology unknown, history of dialysis  . Shingles     October 2014  . Diabetes mellitus without complication    Medications:  Scheduled:  . ALPRAZolam  1 mg Oral QHS  . [START ON 02/02/2013] calcitRIOL  0.25 mcg Oral Q M,W,F  . calcium carbonate  1 tablet Oral BID WC  . ceFEPime (MAXIPIME) IV  1 g Intravenous Q12H  . digoxin  0.25 mg Intravenous Once  . digoxin  0.5 mg Intravenous Once  . [START ON 02/02/2013] digoxin  0.125 mg Oral Daily  . furosemide  40 mg Intravenous Q12H  . gabapentin  100 mg Oral QHS  . insulin aspart  0-9 Units Subcutaneous TID WC  . metoprolol  75 mg Oral BID  . sirolimus  1 mg Oral Daily  . sodium chloride  3 mL Intravenous Q12H  . tacrolimus  1.5 mg Oral BID  . vancomycin  1,000 mg Intravenous Q24H  . warfarin  5 mg Oral Once  . Warfarin - Pharmacist Dosing Inpatient   Does not apply Q24H   Assessment: 80yo female admitted for pneumonia.  [place] of LE's show nonocclusive thrombus of femoral and popliteal vein.  Initiating IV Heparin protocol for DVT with transition to Coumadin.  Day #1 of over-lap  Goal of Therapy:  Heparin level 0.3-0.7  units/ml Monitor platelets by anticoagulation protocol: Yes   Plan:  Heparin 4000 units IV bolus now x 1 Heparin infusion at 1100 units/hr (~16 units/Kg/hr) Check heparin level in 6-8 hrs then daily Coumadin 5mg  po today x 1 at 4pm INR daily Overlap with Coumadin at least 5 days and until INR > 2 x 24h Monitor CBC  Hall, Scott A 02/01/2013,11:51 AM  Heparin level therapeutic.  F/U morning labs.  Netta Cedars, PharmD, BCPS 02/01/2013@5 :29 PM

## 2013-02-01 NOTE — Progress Notes (Signed)
[facility] PROGRESS NOTE  [name] IEP:[id number] DOB: December 02, 1930 DOA: 01/31/2013 PCP: [name]., MD Brief HPI: 80 year old female who has a past medical history of ESRD (end stage renal disease); status post kidney transplant, Shingles; and Diabetes mellitus without complication, was sent from skilled nursing facility after patient developed shortness of breath. Patient had ultrasound of the lower extremity which showed nonocclusive thrombus in the femoral and popliteal vein of the right lower extremity. This appears progressive since the study on 01/15/2013. Likely comparable for acute DVT. Patient recently was discharged from the hospital after she was treated for acute diastolic heart failure. In the ED patient was found to be in atrial fibrillation with rapid response, and has been started on Cardizem infusion.  Also patient has abnormal UA and received Cipro in the ED. CT angiogram of the chest is negative for pulmonary embolism. But concern for atelectasis versus pneumonia. She was admitted for management of afib with RVR, pneumonia, UTI and acute DVT.  Cardiology consulted.   Assessment/Plan:  1. Acute respiratory failure secondary to acute on chronic diastolic heart failure/ Health care associated pneumonia: - admitted to stepdown . Elevated pro bnp - she was started on IV lasix 40 mg BID.  - Daily weight's - intake and output.  - started pt on IV vancomycin and IV cefepime. Recommend repeating CXR in 48 hours, to see for improvement.  - bronchodilators as needed.  - Urine for streptococcal and legionella pending.  -  Unfortunately blood cultures not done and sputum cultures ordered and pending.    2. New Onset Afib with RVR:  - on cardizem drip and dig bolus and on dig for better rate control.  On metoprolol 75 mg BID.  Echocardiogram  Done on 1/11 -c ardiology consulted and recommendations appreciated.    3.LLE DVT: - on IV heparin.  - coumadin as per  pharmacy.   4. Mild acute renal insufficiency; - probably from diuresis.   5. H/o Renal Transplant: - resume immunosuppressants.  6. UTI: - Urine cultures pending. On appropriate antibiotics.    7. Diabetes Mellitus: - not well controlled.  - hgba1c  - SSI.    8. Neutropenia;  Abs neu is around 1100.  Continue to monitor.    DVT prophylaxis.     Code Status: dnr Family Communication: family at bedside Disposition Plan: pending   Consultants:  cardiology  Procedures:  Echocardiogram done on 1/11  Ct chest   Antibiotics:  IV vancomycin 1/26  IV cefepime  HPI/Subjective: Comfortable, appears in good spirits Objective: Filed Vitals:   02/01/13 0900  BP: 123/67  Pulse:   Temp:   Resp: 28    Intake/Output Summary (Last 24 hours) at 02/01/13 1231 Last data filed at 02/01/13 0820  Gross per 24 hour  Intake    630 ml  Output    175 ml  Net    455 ml   Filed Weights   02/01/13 0300 02/01/13 0500  Weight: 70.4 kg (155 lb 3.3 oz) 72 kg (158 lb 11.7 oz)    Exam:   General:  Alert afebrile comfortable  Cardiovascular: s1s2  Respiratory: scattered rales at bases  Abdomen: soft NT ND BS+  Musculoskeletal: no pedal edema.   Data Reviewed: Basic Metabolic Panel:  Recent Labs Lab 01/31/13 1825 02/01/13 0424  NA 137 136*  K 4.1 4.3  CL 95* 95*  CO2 28 24  GLUCOSE 236* 407*  BUN 25* 29*  CREATININE 1.08 1.17*  CALCIUM 10.1 9.0  Liver Function Tests:  Recent Labs Lab 01/31/13 1825 02/01/13 0424  AST 17 12  ALT 10 8  ALKPHOS 84 70  BILITOT 0.6 0.4  PROT 7.7 6.3  ALBUMIN 3.3* 2.6*   No results found for this basename: LIPASE, AMYLASE,  in the last 168 hours No results found for this basename: AMMONIA,  in the last 168 hours CBC:  Recent Labs Lab 01/31/13 1825 02/01/13 0424  WBC 3.7* 1.3*  NEUTROABS 3.0 1.1*  HGB 11.5* 10.1*  HCT 35.1* 31.5*  MCV 83.6 83.6  PLT 203 175   Cardiac Enzymes:  Recent Labs Lab  01/31/13 2314  TROPONINI <0.30   BNP (last 3 results)  Recent Labs  01/15/13 0516 01/31/13 2314  PROBNP 13226.0* 12081.0*   CBG:  Recent Labs Lab 01/29/13 2143 01/30/13 0737 01/30/13 1224 01/30/13 1617 01/31/13 0550  GLUCAP 211* 166* 173* 239* 168*    No results found for this or any previous visit (from the past 240 hour(s)).   Studies: Dg Chest 1 View  01/31/2013   CLINICAL DATA:  Shortness of breath, CHF  EXAM: CHEST - 1 VIEW  COMPARISON:  01/17/2013  FINDINGS: Rotated to the left.  Enlargement of cardiac silhouette.  Atherosclerotic calcification aortic arch.  Slight pulmonary vascular congestion.  Interstitial infiltrates likely pulmonary edema, is improved from previous exam.  Increased bibasilar atelectasis and again noted small bibasilar effusions.  No pneumothorax.  Bones demineralized with orthopedic hardware at the proximal left humerus.  IMPRESSION: Minimal CHF improved from previous exam.  Bibasilar atelectasis and small pleural effusions.   Electronically Signed   By: Lavonia Dana M.D.   On: 01/31/2013 17:21   Ct Angio Chest Pe W/cm &/or Wo Cm  01/31/2013   CLINICAL DATA:  History of renal transplant, shortness of breath.  EXAM: CT ANGIOGRAPHY CHEST WITH CONTRAST  TECHNIQUE: Multidetector CT imaging of the chest was performed using the standard protocol during bolus administration of intravenous contrast. Multiplanar CT image reconstructions including MIPs were obtained to evaluate the vascular anatomy.  CONTRAST:  36mL OMNIPAQUE IOHEXOL 350 MG/ML SOLN  COMPARISON:  01/31/2013 radiograph, 01/15/2013 abdomen CT  FINDINGS: The pulmonary arterial vessels are patent.  Mild ectasia of the ascending aorta up to 3.6 cm, tapering along the articular normal caliber. There is scattered atherosclerotic disease of the aorta and branch vessels.  Mild cardiac enlargement. Coronary artery and aortic valvular calcifications. Trace pericardial fluid and/or thickening.  Moderate to large  bilateral pleural effusions. Associated airspace consolidations.  Reactive sized intrathoracic lymph nodes. Heterogeneous thyroid gland with multiple small nodules.  Upper abdominal images show a 3.1 cm peripherally calcified ovoid lesion in the region of the left adrenal gland, indeterminate however unchanged from the recent prior.  Interlobular septal thickening and hazy airspace opacities. Linear opacities within the middle lobe and lingula, favored to reflect atelectasis.  Multilevel degenerative changes, flowing anterior osteophytes. No acute osseous finding.  Review of the MIP images confirms the above findings.  IMPRESSION: No pulmonary embolism.  Pulmonary edema/ CHF pattern.  Moderate to large bilateral pleural effusions with associated airspace consolidations; atelectasis versus pneumonia.   Electronically Signed   By: Carlos Levering M.D.   On: 01/31/2013 22:55   US Venous Img Lower Unilateral Right  01/31/2013   CLINICAL DATA:  Right lower extremity pain.  EXAM: RIGHT LOWER EXTREMITY VENOUS DOPPLER ULTRASOUND  TECHNIQUE: Gray-scale sonography with graded compression, as well as color Doppler and duplex ultrasound, were performed to evaluate the deep venous system from  the level of the common femoral vein through the popliteal and proximal calf veins. Spectral Doppler was utilized to evaluate flow at rest and with distal augmentation maneuvers.  COMPARISON:  US VENOUS IMG LOWER  BILATERAL dated 01/15/2013  FINDINGS: Thrombus within deep veins: Nonocclusive thrombus is again identified in the femoral vein at the level of the mid to distal thigh. There does appear to be some progression since the prior ultrasound study with some nonocclusive thrombus now also evident in the popliteal vein, which is new.  Duplex waveform respiratory phasicity:  Normal.  Duplex waveform response to augmentation:  Normal.  Venous reflux:  None visualized.  Other findings: No evidence of superficial thrombophlebitis or  abnormal fluid collection.  IMPRESSION: Nonocclusive thrombus in the femoral and popliteal vein of the right lower extremity. This appears progressive since the prior study on 01/15/2013 and there likely is a component of acute DVT which may have propagated from a segment of more chronic appearing thrombus previously.   Electronically Signed   By: Aletta Edouard M.D.   On: 01/31/2013 15:59    Scheduled Meds: . ALPRAZolam  1 mg Oral QHS  . [START ON 02/02/2013] calcitRIOL  0.25 mcg Oral Q M,W,F  . calcium carbonate  1 tablet Oral BID WC  . ceFEPime (MAXIPIME) IV  1 g Intravenous Q12H  . digoxin  0.25 mg Intravenous Once  . [START ON 02/02/2013] digoxin  0.125 mg Oral Daily  . furosemide  40 mg Intravenous Q12H  . gabapentin  100 mg Oral QHS  . insulin aspart  0-9 Units Subcutaneous TID WC  . metoprolol  75 mg Oral BID  . sirolimus  1 mg Oral Daily  . sodium chloride  3 mL Intravenous Q12H  . tacrolimus  1.5 mg Oral BID  . vancomycin  1,000 mg Intravenous Q24H  . warfarin  5 mg Oral Once  . Warfarin - Pharmacist Dosing Inpatient   Does not apply Q24H   Continuous Infusions: . diltiazem (CARDIZEM) infusion Stopped (02/01/13 0242)  . heparin 1,100 Units/hr (02/01/13 0820)    Principal Problem:   Atrial fibrillation with RVR Active Problems:   HYPERTENSION   Sepsis secondary to UTI   Acute diastolic heart failure   Pneumonia   Atrial fibrillation with rapid ventricular response   DVT (deep venous thrombosis)   DM (diabetes mellitus)   Acute on chronic diastolic CHF (congestive heart failure), NYHA class 1    Time spent: 35  min    Tallan Sandoz  [facility] Pager [phone]. If 7PM-7AM, please contact night-coverage at [email or web address], password [facility] 02/01/2013, 12:31 PM  LOS: 1 day

## 2013-02-01 NOTE — Progress Notes (Signed)
ANTIBIOTIC CONSULT NOTE- follow up  Pharmacy Consult for Vancomycin Indication: Pneumonia   Allergies  Allergen Reactions  . Codeine     REACTION: UNKNOWN REACTION  . Penicillins    Patient Measurements: Height: 5\' 8"  (172.7 cm) Weight: 158 lb 11.7 oz (72 kg) IBW/kg (Calculated) : 63.9  Vital Signs: Temp: 97.8 F (36.6 C) (01/27 0500) Temp src: Rectal (01/26 2159) BP: 110/63 mmHg (01/27 0630) Pulse Rate: 130 (01/27 0328)  Labs:  Recent Labs  01/31/13 1825 02/01/13 0424  WBC 3.7* 1.3*  HGB 11.5* 10.1*  PLT 203 175  CREATININE 1.08 1.17*   Estimated Creatinine Clearance: 38.7 ml/min (by C-G formula based on Cr of 1.17).  No results found for this basename: VANCOTROUGH, Corlis Leak, VANCORANDOM, [place], GENTPEAK, [place], [place], TOBRAPEAK, TOBRARND, AMIKACINPEAK, AMIKACINTROU, AMIKACIN,  in the last 72 hours   Microbiology: Recent Results (from the past 720 hour(s))  URINE CULTURE     Status: None   Collection Time    01/15/13  5:20 AM      Result Value Range Status   Specimen Description URINE, CATHETERIZED   Final   Special Requests NONE   Final   Culture  Setup Time     Final   Value: 01/15/2013 19:21     Performed at [facility]     Final   Value: >=100,000 COLONIES/ML     Performed at [facility]   Culture     Final   Value: KLEBSIELLA PNEUMONIAE     Performed at [facility]   Report Status 01/18/2013 FINAL   Final   Organism ID, Bacteria KLEBSIELLA PNEUMONIAE   Final  CULTURE, BLOOD (ROUTINE X 2)     Status: None   Collection Time    01/15/13  5:54 AM      Result Value Range Status   Specimen Description BLOOD LEFT HAND   Final   Special Requests     Final   Value: BOTTLES DRAWN AEROBIC AND ANAEROBIC 6CC EACH BOTTLE   Culture NO GROWTH 5 DAYS   Final   Report Status 01/20/2013 FINAL   Final  CULTURE, BLOOD (ROUTINE X 2)     Status: None   Collection Time    01/15/13  6:01 AM      Result Value  Range Status   Specimen Description BLOOD LEFT ARM   Final   Special Requests BOTTLES DRAWN AEROBIC ONLY [place]   Final   Culture NO GROWTH 5 DAYS   Final   Report Status 01/20/2013 FINAL   Final  MRSA PCR SCREENING     Status: None   Collection Time    01/15/13  9:50 AM      Result Value Range Status   MRSA by PCR NEGATIVE  NEGATIVE Final   Comment:            The GeneXpert MRSA Assay (FDA     approved for NASAL specimens     only), is one component of a     comprehensive MRSA colonization     surveillance program. It is not     intended to diagnose MRSA     infection nor to guide or     monitor treatment for     MRSA infections.  CLOSTRIDIUM DIFFICILE BY PCR     Status: None   Collection Time    01/19/13  3:46 PM      Result Value Range Status   C  difficile by pcr NEGATIVE  NEGATIVE Final   Medical History: Past Medical History  Diagnosis Date  . ESRD (end stage renal disease)     etiology unknown, history of dialysis  . Shingles     October 2014  . Diabetes mellitus without complication    Medications:  Cipro 1/26 x 1 dose Cefepime 1/27 >> Vancomycin 1/27 >>  Assessment: 80 yo female admitted from skilled nursing facility with SOB, increased temperature, UTI, afib w/ RVR, and DVT. PMH sig for renal transplant on immunosuppresive Tx. PT is s/p recent hospitalization for acute heart failure. Empiric Abx for possible HCAP.  Estimated Creatinine Clearance: 38.7 ml/min (by C-G formula based on Cr of 1.17).  Goal of Therapy:  Vancomycin troughs 15-20 mcg/ml  Plan:  Vancomycin 1000mg  IV q24hrs Check trough at steady state Continue Cefepime 1gm IV q12hrs Monitor labs, renal fxn, and cultures  Ena Dawley, RPH 02/01/2013,8:01 AM

## 2013-02-01 NOTE — Progress Notes (Signed)
Inpatient Diabetes Program Recommendations  AACE/ADA: New Consensus Statement on Inpatient Glycemic Control (2013)  Target Ranges:  Prepandial:   less than 140 mg/dL      Peak postprandial:   less than 180 mg/dL (1-2 hours)      Critically ill patients:  140 - 180 mg/dL   Results for KOREEN, LIZAOLA (MRN [id number]) as of 02/01/2013 11:59  Ref. Range 01/31/2013 18:25 02/01/2013 04:24  Glucose Latest Range: 70-99 mg/dL 236 (H) 407 (H)    Inpatient Diabetes Program Recommendations  Correction (SSI): Noted Novolog sensitive correction scale was ordered to start today.  HgbA1C: Please consider ordering an A1C to evaluate glycemic control over the past 2-3 months.  Note: Patient has a history of diabetes and takes Amaryl 1 mg daily with supper as an outpatient for diabetes management.  Currently, patient is ordered to receive Novolog 0-9 units TID with meals for inpatient glycemic control.  Noted patient was given Solumedrol 125 mg on 01/31/13 @ 18:26 which is likely cause of blood glucose over 400 mg/dl this morning.  Noted Novolog correction has been ordered to start today at noon.  Please order an A1C.  Will continue to follow.  Thanks, Barnie Alderman, RN, MSN, CCRN Diabetes Coordinator Inpatient Diabetes Program [phone] (Team Pager) [phone] (AP office) [phone] [facility] office)

## 2013-02-01 NOTE — Progress Notes (Signed)
INITIAL NUTRITION ASSESSMENT  DOCUMENTATION CODES Per approved criteria  -Not Applicable   INTERVENTION: CHO modified diet per MD  Snacks between meals as needed  NUTRITION DIAGNOSIS: None at this time  Goal: Pt to meet >/= 90% of their estimated nutrition needs    Monitor:  Po intake, labs and wt trends   Reason for Assessment: Malnutrition Screen Score =  2  80 y.o. female  Admitting Dx: Atrial fibrillation with RVR Patient Active Problem List   Diagnosis Date Noted  . Atrial fibrillation with rapid ventricular response 02/01/2013  . DVT (deep venous thrombosis) 02/01/2013  . DM (diabetes mellitus) 02/01/2013  . Acute on chronic diastolic CHF (congestive heart failure), NYHA class 1 02/01/2013  . Atrial fibrillation with RVR 01/31/2013  . Acute diastolic heart failure 29/79/8921  . Pneumonia 01/17/2013  . CHF (congestive heart failure) 01/15/2013  . Sepsis secondary to UTI 01/15/2013  . Acute respiratory failure with hypoxia 01/15/2013  . Encephalopathy 01/15/2013  . Sepsis 01/15/2013  . CLOSED FRACTURE OF SHAFT OF HUMERUS 06/18/2009  . TRIGGER FINGER 03/12/2009  . GERD 02/16/2009  . GASTROPARESIS 02/16/2009  . CONSTIPATION 02/16/2009  . 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 HYPERTENSION 02/12/2009  . RENAL FAILURE 02/12/2009  . SKIN CANCER, HX OF 02/12/2009  . TUBERCULOSIS, HX OF 02/12/2009  . MITRAL VALVE PROLAPSE, HX OF 02/12/2009  . DIVERTICULITIS, HX OF 02/12/2009     ASSESSMENT: Pt from [facility]. She is receiving CHO modified diet with good appetite PTA. Feeds herself and plans to return home at discharge. No physical signs of malnutrition noted.   Height: Ht Readings from Last 1 Encounters:  02/01/13 5\' 8"  (1.727 m)    Weight: Wt Readings from Last 1 Encounters:  02/01/13 158 lb 11.7 oz (72 kg)    Ideal Body Weight: 140# (63.6 kg)  % Ideal Body Weight: 114%  Wt Readings from Last 10 Encounters:  02/01/13 158 lb 11.7 oz (72 kg)  01/20/13  144 lb 0.6 oz (65.334 kg)  06/18/09 175 lb (79.379 kg)  03/12/09 180 lb (81.647 kg)  02/13/09 187 lb (84.823 kg)    Usual Body Weight: 150#  % Usual Body Weight: 105%  BMI:  Body mass index is 24.14 kg/(m^2).normal  Estimated Nutritional Needs: Kcal: 1450-1650 Protein: 60-70 gr Fluid: 1.5-1.7 liters daily  Skin: intact  Diet Order: Carb Control  EDUCATION NEEDS: -No education needs identified at this time   Intake/Output Summary (Last 24 hours) at 02/01/13 1555 Last data filed at 02/01/13 1300  Gross per 24 hour  Intake   1415 ml  Output    575 ml  Net    840 ml    Last BM:   Labs:   Recent Labs Lab 01/31/13 1825 02/01/13 0424  NA 137 136*  K 4.1 4.3  CL 95* 95*  CO2 28 24  BUN 25* 29*  CREATININE 1.08 1.17*  CALCIUM 10.1 9.0  GLUCOSE 236* 407*    CBG (last 3)   Recent Labs  01/30/13 1617 01/31/13 0550 02/01/13 1246  GLUCAP 239* 168* 402*    Scheduled Meds: . ALPRAZolam  1 mg Oral QHS  . [START ON 02/02/2013] calcitRIOL  0.25 mcg Oral Q M,W,F  . calcium carbonate  1 tablet Oral BID WC  . ceFEPime (MAXIPIME) IV  1 g Intravenous Q12H  . digoxin  0.25 mg Intravenous Once  . [START ON 02/02/2013] digoxin  0.125 mg Oral Daily  . furosemide  40 mg Intravenous Q12H  .  gabapentin  100 mg Oral QHS  . insulin aspart  0-9 Units Subcutaneous TID WC  . metoprolol  75 mg Oral BID  . sirolimus  1 mg Oral Daily  . sodium chloride  3 mL Intravenous Q12H  . tacrolimus  1.5 mg Oral BID  . vancomycin  1,000 mg Intravenous Q24H  . warfarin  5 mg Oral Once  . Warfarin - Pharmacist Dosing Inpatient   Does not apply Q24H    Continuous Infusions: . diltiazem (CARDIZEM) infusion Stopped (02/01/13 0242)  . heparin 1,100 Units/hr (02/01/13 1300)    Past Medical History  Diagnosis Date  . ESRD (end stage renal disease)     etiology unknown, history of dialysis  . Shingles     October 2014  . Diabetes mellitus without complication     Past Surgical  History  Procedure Laterality Date  . Kidney transplant      about 2008  . Nephrectomy transplanted organ    . Dialysis fistula creation Left   . Prostetic eye Left     cornea damage  . Left arm fracture    . Cholecystectomy    . Abdominal hysterectomy      Colman Cater MS,RD,CSG,LDN Office: #[phone] Pager: [phone]

## 2013-02-01 NOTE — Consult Note (Signed)
CARDIOLOGY CONSULT NOTE   Patient ID: [name] MRN: [id number] DOB/AGE: August 14, 1930 80 y.o.  Admit Date: 01/31/2013 Referring Physician: PTH Primary Physician: [name]., MD Consulting Cardiologist: Carlyle Dolly Primary Cardiologist;New Daryn Hicks MD Reason for Consultation: Afib RVR and CHF  Clinical Summary [name] is a 80 y.o.female with known history of ESRD, status post kidney transplant 2008 on antirejection therapy diabetes, who was recently discharge from [facility] on 01/15/2013 after admission for UTI bacteremia/sepsis, with diastolic CHF, subacute encephalopathy, with hx of chronic right LE DVT identified on last admission. She was discharged to [facility]. Wt on discharge 144 lbs.        She was admitted with atrial fib with RVR with symptoms of shortness of breath and hypoxia while at SNF. In ER she initially presented with sinus tachycardia rate of 103 bpm but a few hours later transitioned to atrial fib with RVR. She was started on diltiazem gtt after bolus. . She was also found to be positive for UTI and started on IV cipro. CXR demonstrated large bilateral pleural effusions with pulmonary edema/CHF pattern. Wtr on admission 158 lbs           Family at bedside states that she had been having a cough and chest congestion for the last 2 weeks. Was treated by PCP with Z-pak. Was also noted to have LEE bilaterally. She was difficult to arouse for two days prior to admission on family visitation. Was seen by [facility] physician on Monday due to ongoing symptoms and was sent for CXR. When she came back from CXR, she became hypoxic.          She was restarted on lasix po.instead of IV due to hypotension. Echo on last admission demonstrated normal LVEF with grade II diastolic dysfunction.   She remains in afib with RVR currently.              Allergies  Allergen Reactions  . Codeine     REACTION: UNKNOWN REACTION  . Penicillins     Medications Scheduled Medications: .  ALPRAZolam  1 mg Oral QHS  . [START ON 02/02/2013] calcitRIOL  0.25 mcg Oral Q M,W,F  . calcium carbonate  1 tablet Oral BID WC  . ceFEPime (MAXIPIME) IV  1 g Intravenous Q12H  . furosemide  40 mg Intravenous Q12H  . gabapentin  100 mg Oral QHS  . metoprolol  75 mg Oral BID  . sirolimus  1 mg Oral Daily  . sodium chloride  3 mL Intravenous Q12H  . tacrolimus  1.5 mg Oral BID  . vancomycin  1,000 mg Intravenous Q24H     Infusions: . diltiazem (CARDIZEM) infusion Stopped (02/01/13 0242)  . heparin 1,100 Units/hr (02/01/13 0820)     PRN Medications:  sodium chloride, ondansetron (ZOFRAN) IV, ondansetron, sodium chloride   Past Medical History  Diagnosis Date  . ESRD (end stage renal disease)     etiology unknown, history of dialysis  . Shingles     October 2014  . Diabetes mellitus without complication     Past Surgical History  Procedure Laterality Date  . Kidney transplant      about 2008  . Nephrectomy transplanted organ    . Dialysis fistula creation Left   . Prostetic eye Left     cornea damage  . Left arm fracture    . Cholecystectomy    . Abdominal hysterectomy      Family History  Problem Relation Age of Onset  .  Cancer Father     colon    Social History [name] reports that she has never smoked. She does not have any smokeless tobacco history on file. [name] reports that she does not drink alcohol.  Review of Systems Otherwise reviewed and negative except as outlined.  Physical Examination Blood pressure 123/67, pulse 130, temperature 98 F (36.7 C), temperature source Axillary, resp. rate 28, height 5\' 8"  (1.727 m), weight 158 lb 11.7 oz (72 kg), SpO2 95.00%.  Intake/Output Summary (Last 24 hours) at 02/01/13 0917 Last data filed at 02/01/13 0820  Gross per 24 hour  Intake    630 ml  Output    175 ml  Net    455 ml    Telemetry:Atrial fib with rate of 134 bpm.  HEENT: Conjunctiva and lids normal, oropharynx clear with moist  mucosa.Frail Neck: Supple,  elevated JVP, no carotid bruits, no thyromegaly. Lungs: Clear to auscultation in the upper lobes, but diminished in the bases, with poor respiratory effort.  Cardiac: Rapid, irregular rate and rhythm, no S3 or significant systolic murmur, no pericardial rub. Abdomen: Soft, nontender, no hepatomegaly, bowel sounds present, no guarding or rebound. Extremities: 2+ pretibial pitting edema, distal pulses 2+. Skin: Warm and dry. Musculoskeletal: No kyphosis. Neuropsychiatric: Alert and oriented x3, affect grossly appropriate.She is HOH.  Prior Cardiac Testing/Procedures  1.Echocardiogram 01/16/2013 Left ventricle: The cavity size was normal. Wall thickness was increased in a pattern of moderate LVH. Systolic function was normal. The estimated ejection fraction was in the range of 60% to 65%. Wall motion was normal; there were no regional wall motion abnormalities. Features are consistent with a pseudonormal left ventricular filling pattern, with concomitant abnormal relaxation and increased filling pressure (grade 2 diastolic dysfunction). Doppler parameters are consistent with high ventricular filling pressure. - Aortic valve: Mildly calcified annulus. Trileaflet; mildly thickened, mildly calcified leaflets. - Mitral valve: No significant mitral valve prolapse noted. Calcified annulus. Mildly thickened leaflets . - Left atrium: The atrium was moderately dilated. - Right atrium: The atrium was mildly dilated. - Systemic veins: IVC mildly dilated with normal respirophasic response (estimated CVP of 8 mmHg). - Pericardium, extracardiac: Pleural effusion with stranding noted.   Lab Results  Basic Metabolic Panel:  Recent Labs Lab 01/31/13 1825 02/01/13 0424  NA 137 136*  K 4.1 4.3  CL 95* 95*  CO2 28 24  GLUCOSE 236* 407*  BUN 25* 29*  CREATININE 1.08 1.17*  CALCIUM 10.1 9.0    Liver Function Tests:  Recent Labs Lab 01/31/13 1825  02/01/13 0424  AST 17 12  ALT 10 8  ALKPHOS 84 70  BILITOT 0.6 0.4  PROT 7.7 6.3  ALBUMIN 3.3* 2.6*    CBC:  Recent Labs Lab 01/31/13 1825 02/01/13 0424  WBC 3.7* 1.3*  NEUTROABS 3.0 1.1*  HGB 11.5* 10.1*  HCT 35.1* 31.5*  MCV 83.6 83.6  PLT 203 175    Cardiac Enzymes:  Recent Labs Lab 01/31/13 2314  TROPONINI <0.30    BNP: 01/31/2013 12081.0 01/15/2013 13226.0   Radiology: Dg Chest 1 View  01/31/2013   CLINICAL DATA:  Shortness of breath, CHF  EXAM: CHEST - 1 VIEW  COMPARISON:  01/17/2013  FINDINGS: Rotated to the left.  Enlargement of cardiac silhouette.  Atherosclerotic calcification aortic arch.  Slight pulmonary vascular congestion.  Interstitial infiltrates likely pulmonary edema, is improved from previous exam.  Increased bibasilar atelectasis and again noted small bibasilar effusions.  No pneumothorax.  Bones demineralized with orthopedic hardware at the  proximal left humerus.  IMPRESSION: Minimal CHF improved from previous exam.  Bibasilar atelectasis and small pleural effusions.   Electronically Signed   By: Lavonia Dana M.D.   On: 01/31/2013 17:21   Ct Angio Chest Pe W/cm &/or Wo Cm  01/31/2013   CLINICAL DATA:  History of renal transplant, shortness of breath.  EXAM: CT ANGIOGRAPHY CHEST WITH CONTRAST  TECHNIQUE: Multidetector CT imaging of the chest was performed using the standard protocol during bolus administration of intravenous contrast. Multiplanar CT image reconstructions including MIPs were obtained to evaluate the vascular anatomy.  CONTRAST:  IMPRESSION: No pulmonary embolism.  Pulmonary edema/ CHF pattern.  Moderate to large bilateral pleural effusions with associated airspace consolidations; atelectasis versus pneumonia.   Electronically Signed   By: Carlos Levering M.D.   On: 01/31/2013 22:55   US Venous Img Lower Unilateral Right  01/31/2013   CLINICAL DATA:  Right lower extremity pain.  EXAM: RIGHT LOWER EXTREMITY VENOUS DOPPLER ULTRASOUND   TECHNIQUE.  IMPRESSION: Nonocclusive thrombus in the femoral and popliteal vein of the right lower extremity. This appears progressive since the prior study on 01/15/2013 and there likely is a component of acute DVT which may have propagated from a segment of more chronic appearing thrombus previously.   Electronically Signed   By: Aletta Edouard M.D.   On: 01/31/2013 15:59    [id number] fib with ST abnormalities in the infero/lateral leads.   Impression and Recommendations"  1. New onset atrial fib: May be related to acute illness with UTI. She was placed on cardizem gtt with some BP softness,. She is not rate controlled. She has been placed on IV heparin. CHADs-VASc score 4 (Age, DM, CHF).  Consideration for use of amiodarone, but cautious with anti-rejection medications as this can cause OT prolongation and increased levels of immunosuppressive medications.      Consider adding digoxin creatinine at  1.17. Heart rate up from multiple sources. Infection, DVT.GFR is 44. Will discuss dosing of digoxin with pharmacy. Also consideration for coumadin therapy. Will consult pharmacy for recommendations.    After speaking with pharmacy, they recommend 0.5mg  IV X1, 0.25 mg IV x 1 in 6 hours, and then po. 0.125 mg daily.They will follow. I have ordered the first dose.  2. Acute/Chronic Diastolic CHF: She remains fluid overloaded, likely due to uncontrolled HR.  Will keep at current po dose for now. Once rate is controlled, hopefully she will diurese.  3. DVT: She was not placed on anticoagulation on last admission.She has history of frequent falls. Now on heparin. This appears to be progressive per ultrasound.   4. UTI: Being treated with IV Cipro and Vancomycin.   5. S/P Kidney transplant: On tacrolimus and sirolimus. Followed at [facility].     Signed: Phill Myron. Lawrence NP [facility] 02/01/2013, 9:17 AM Co-Sign MD 79 yo female with hx of prior kidney transplant on chronic  immunosuppressants, DM, and chronic diastolic heart failure ([id number] LVEF [id number], grade 2 diastolic dysfunction), and chronic DVT admitted with SOB. Found to be in afib with RVR (afib is a new diagnosis for her), she is also volume overloaded with pulmonary edema on chest xray, CT PE negative for clot but with moderate to large bilateral pleural effusions, pro-BNP 12081, trop neg, EKG with afib with RVR. She has been managed on dilt drip for rates, titration has been limited due to soft blood pressures. The is on lasix 40mg  bid. There is also some concern for possible sepsis due  to dirty UA, possible lung infiltrate in an immunosuppressed patient. She has been started on abx. Will start digoxin for better rate control in the setting of soft blood pressures, avoiding amio due to increased risk of interaction with immunosuppresants. Will load dig IV for toal of 1mg  today and start maintence dose tomorrow. Continue current IV diuretics, avoid overly aggressive diuresis as there is some concern for possible infection and sepsis/vasodilatation. Continue heparin gtt in setting of afib and DVT. Patient initially presented in sinus tach in the setting of her SOB, later went into afib. Afib is not the sole etiology of her SOB, and thus from history it is difficult to assess how long she may have been having intermittent episodes of afib. If DCCV becomes neccesary, she would need a TEE prior.  Carlyle Dolly MD

## 2013-02-01 NOTE — Progress Notes (Signed)
ANTIBIOTIC CONSULT NOTE-Preliminary  Pharmacy Consult for Vancomycin Indication: Pneumonia   Allergies  Allergen Reactions  . Codeine     REACTION: UNKNOWN REACTION  . Penicillins     Patient Measurements:    Vital Signs: Temp: 100.5 F (38.1 C) (01/26 2159) Temp src: Rectal (01/26 2159) BP: 70/35 mmHg (01/27 0300) Pulse Rate: 112 (01/27 0000)  Labs:  Recent Labs  01/31/13 1825  WBC 3.7*  HGB 11.5*  PLT 203  CREATININE 1.08    The CrCl is unknown because both a height and weight (above a minimum accepted value) are required for this calculation.  No results found for this basename: VANCOTROUGH, Corlis Leak, VANCORANDOM, Grand, GENTPEAK, Speed, Normanna, TOBRAPEAK, TOBRARND, AMIKACINPEAK, AMIKACINTROU, AMIKACIN,  in the last 72 hours   Microbiology: Recent Results (from the past 720 hour(s))  URINE CULTURE     Status: None   Collection Time    01/15/13  5:20 AM      Result Value Range Status   Specimen Description URINE, CATHETERIZED   Final   Special Requests NONE   Final   Culture  Setup Time     Final   Value: 01/15/2013 19:21     Performed at [facility]     Final   Value: >=100,000 COLONIES/ML     Performed at [facility]   Culture     Final   Value: KLEBSIELLA PNEUMONIAE     Performed at [facility]   Report Status 01/18/2013 FINAL   Final   Organism ID, Bacteria KLEBSIELLA PNEUMONIAE   Final  CULTURE, BLOOD (ROUTINE X 2)     Status: None   Collection Time    01/15/13  5:54 AM      Result Value Range Status   Specimen Description BLOOD LEFT HAND   Final   Special Requests     Final   Value: BOTTLES DRAWN AEROBIC AND ANAEROBIC 6CC EACH BOTTLE   Culture NO GROWTH 5 DAYS   Final   Report Status 01/20/2013 FINAL   Final  CULTURE, BLOOD (ROUTINE X 2)     Status: None   Collection Time    01/15/13  6:01 AM      Result Value Range Status   Specimen Description BLOOD LEFT ARM   Final   Special  Requests BOTTLES DRAWN AEROBIC ONLY [place]   Final   Culture NO GROWTH 5 DAYS   Final   Report Status 01/20/2013 FINAL   Final  MRSA PCR SCREENING     Status: None   Collection Time    01/15/13  9:50 AM      Result Value Range Status   MRSA by PCR NEGATIVE  NEGATIVE Final   Comment:            The GeneXpert MRSA Assay (FDA     approved for NASAL specimens     only), is one component of a     comprehensive MRSA colonization     surveillance program. It is not     intended to diagnose MRSA     infection nor to guide or     monitor treatment for     MRSA infections.  CLOSTRIDIUM DIFFICILE BY PCR     Status: None   Collection Time    01/19/13  3:46 PM      Result Value Range Status   C difficile by pcr NEGATIVE  NEGATIVE Final    Medical History: Past  Medical History  Diagnosis Date  . ESRD (end stage renal disease)     etiology unknown, history of dialysis  . Shingles     October 2014  . Diabetes mellitus without complication     Medications:  Ciprofloxacin 400 mg IV x 1 dose in the ED Cefepime 1 Gm IV every 12 hours  Assessment: 80 yo female admitted from skilled nursing facility with SOB, increased temperature, UTI, afib w/ RVR, and DVT. PMH sig for renal transplant on immunosuppresive Tx. PT is s/p recent hospitalization for acute heart failure. Empiric Abx for possible HCAP.  Goal of Therapy:  Vancomycin troughs 15-22 mcg/ml  Plan:  Preliminary review of pertinent patient information completed.  Protocol will be initiated with a one-time dose of Vancomycin 500 mg IV.  Forestine Na clinical pharmacist will complete review during morning rounds to assess patient and finalize treatment regimen.  Norberto Sorenson, [facility] 02/01/2013,3:22 AM

## 2013-02-01 NOTE — Care Management Note (Addendum)
    Page 1 of 2   02/07/2013     3:18:53 PM   CARE MANAGEMENT NOTE 02/07/2013  Patient:  [name], [name]   Account Number:  [id number]  Date Initiated:  02/01/2013  Documentation initiated by:  Theophilus Kinds  Subjective/Objective Assessment:   Pt admitted from [facility] with a fib and new DVt. Pt will return to facility at discharge.     Action/Plan:   CSW to arrange discharge to facility when medically stable.   Anticipated DC Date:  02/05/2013   Anticipated DC Plan:  SKILLED NURSING FACILITY  In-house referral  Clinical Social Worker      DC Forensic scientist  CM consult      [facility] Choice  HOME HEALTH   Choice offered to / List presented to:  C-1 Patient        [facility] arranged  HH-1 RN  [place].   Status of service:  Completed, signed off Medicare Important Message given?  YES (If response is "NO", the following Medicare IM given date fields will be blank) Date Medicare IM given:  02/07/2013 Date Additional Medicare IM given:    Discharge Disposition:  [place]  Per UR Regulation:    If discussed at Long Length of Stay Meetings, dates discussed:    Comments:  02/07/13 [place], RN BSN CM Pt to be discharged today to [facility]. CSW to arrange discharge to facility.  02/02/13 [place], RN BSN CM Pt has elected to go home at discharge and not return to [facility]. Pt will return home with a husband and has a daughter who lives in the back yard. Pt has chosen [facility] for [facility] RN, PT, and aide. Pt has w/c, walker, BSC, and shower chair for home use. Will need to assess need for home O2 prior to discharge. Will continue to follow for discharge planning needs.  02/01/13 [place], RN BSN CM

## 2013-02-01 NOTE — Clinical Social Work Note (Signed)
Clinical Social Work Department BRIEF PSYCHOSOCIAL ASSESSMENT 02/01/2013  Patient:  [name], [name]     Account Number:  [id number]     Admit date:  01/31/2013  Clinical Social Worker:  Edwyna Shell, [place]  Date/Time:  02/01/2013 02:00 PM  Referred by:  CSW  Date Referred:  02/01/2013 Referred for  SNF Placement   Other Referral:   Interview type:  Patient Other interview type:   Also spoke w daughter [name] in room w patient consent, reviewed chart, spoke w RN    PSYCHOSOCIAL DATA Living Status:  FACILITY Admitted from facility:  [facility] Level of care:  [place] Primary support name:  Gabbriella Presswood Primary support relationship to patient:  SPOUSE Degree of support available:   Significant support from family, friends, neighbors.    CURRENT CONCERNS Current Concerns  Post-Acute Placement   Other Concerns:    SOCIAL WORK ASSESSMENT / PLAN CSW met w patient at bedside, patient alert and oriented x4.  Did have minimal issues w length of time, but otherwise oriented.  Patient came to [facility] from [facility] where she was placed 2 weeks ago for rehab following several falls at home.  Was newly diagnosed w diastolic CHF, intended to stay at SNF approx 2 weeks to rehab. Prior to admission was living at home w husband who is quite active, daughter lives in apartment close to parent's home and assists at night.  At home, patient was able to serve her own plate of food (did not cook), ambulate around the house using a walker or wheelchair, get self in/out of bed.  Had kidney transplant several years ago and had been doing well at home.    Rehab at [facility] was difficult - on day one, patient ambulated to PT room and back primarliy using rolling walker.  The second day, patient said "I was sore all over" and did not get out of bed, then on days 3 and following, felt like she was progressively losing strength and was unable to rehab as she had  hoped.  Was transferred into the CHF unit at [facility] and was placed on O2 via nasal cannula.  Family noted progressive swelling of feet and ankles.  Patient was transferred back to [facility] last night.    Patient clearly expressed her desire to return home at discharge.  Although "there is nothing wrong w [facility]", she "wants to spend whatever days I have" at home. Is convinced that she can rehab successfully at home, says she has family and friends who will be with her to assist if needed.  Says husband has already contacted [facility] and notified SNF that they will not be paying to hold bed. CSW will confirm w [facility].  Patient wants RN CM to speak w daughter re options for home health needs, RN CM notified and agreeable.    CSW will continue to follow peripherally and assist if needed, signing off at this time.   Assessment/plan status:  No Further Intervention Required Other assessment/ plan:   Information/referral to community resources:   RN CM for hh needs.    PATIENT'S/FAMILY'S RESPONSE TO PLAN OF CARE: Patient adamant that she wants to return home at discharge, asked that [place] convey her clear wishes to treatment team.       Edwyna Shell, LCSW Clinical Social Worker [phone])

## 2013-02-01 NOTE — H&P (Signed)
PCP:   [name]., MD   Chief Complaint:   Shortness of breath HPI: 80 year old female who   has a past medical history of ESRD (end stage renal disease); status post kidney transplant, Shingles; and Diabetes mellitus without complication, was sent from skilled nursing facility after patient developed shortness of breath. Patient had ultrasound of the lower extremity which showed nonocclusive thrombus in the femoral and popliteal vein of the right lower extremity. This appears progressive since the study on 01/15/2013. Likely comparable for acute DVT. Patient recently was discharged from the hospital after she was treated for acute diastolic heart failure.  Patient denies any chest pain, no nausea vomiting or diarrhea no headache no blurred vision. She does complain of shortness of breath on exertion. In the ED patient was found to be in atrial fibrillation with rapid response, and has been started on Cardizem infusion. Also patient has abnormal UA and received Cipro in the ED. CT angiogram of the chest is negative for pulmonary embolism. But concern for atelectasis versus pneumonia.  Allergies:   Allergies  Allergen Reactions  . Codeine     REACTION: UNKNOWN REACTION  . Penicillins       Past Medical History  Diagnosis Date  . ESRD (end stage renal disease)     etiology unknown, history of dialysis  . Shingles     October 2014  . Diabetes mellitus without complication     Past Surgical History  Procedure Laterality Date  . Kidney transplant      about 2008  . Nephrectomy transplanted organ    . Dialysis fistula creation Left   . Prostetic eye Left     cornea damage  . Left arm fracture    . Cholecystectomy    . Abdominal hysterectomy      Prior to Admission medications   Medication Sig Start Date End Date Taking? Authorizing Provider  ALPRAZolam Duanne Moron) 1 MG tablet Take 1 tablet (1 mg total) by mouth at bedtime. 01/21/13  Yes Tiffany L Reed, DO  amLODipine  (NORVASC) 10 MG tablet Take 10 mg by mouth daily.   Yes Historical Provider, MD  calcitRIOL (ROCALTROL) 0.25 MCG capsule Take 0.25 mcg by mouth every Monday, Wednesday, and Friday.   Yes Historical Provider, MD  calcium carbonate (OS-CAL) 600 MG TABS tablet Take 600 mg by mouth 2 (two) times daily with a meal.   Yes Historical Provider, MD  furosemide (LASIX) 40 MG tablet Take 80 mg by mouth.   Yes Historical Provider, MD  gabapentin (NEURONTIN) 100 MG capsule Take 100 mg by mouth at bedtime.   Yes Historical Provider, MD  glimepiride (AMARYL) 1 MG tablet Take 1 mg by mouth daily with supper.   Yes Historical Provider, MD  metoprolol (LOPRESSOR) 50 MG tablet Take 75 mg by mouth 2 (two) times daily.   Yes Historical Provider, MD  Multiple Vitamin (MULTIVITAMIN) tablet Take 1 tablet by mouth daily.   Yes Historical Provider, MD  sirolimus (RAPAMUNE) 1 MG tablet Take 1 mg by mouth daily.   Yes Historical Provider, MD  tacrolimus (PROGRAF) 0.5 MG capsule Take 0.5 mg by mouth 2 (two) times daily.   Yes Historical Provider, MD  tacrolimus (PROGRAF) 1 MG capsule Take 1 mg by mouth 2 (two) times daily.   Yes Historical Provider, MD  diphenhydramine-acetaminophen (TYLENOL PM) 25-500 MG TABS Take 2 tablets by mouth at bedtime as needed (For sleep).    Historical Provider, MD    Social History:  reports  that she has never smoked. She does not have any smokeless tobacco history on file. She reports that she does not drink alcohol or use illicit drugs.  Family History  Problem Relation Age of Onset  . Cancer Father     colon     All the positives are listed in BOLD  Review of Systems:  HEENT: Headache, blurred vision, runny nose, sore throat Neck: Hypothyroidism, hyperthyroidism,,lymphadenopathy Chest : Shortness of breath, history of COPD, Asthma Heart : Chest pain, history of coronary arterey disease GI:  Nausea, vomiting, diarrhea, constipation, GERD GU: Dysuria, urgency, frequency of  urination, hematuria Neuro: Stroke, seizures, syncope Psych: Depression, anxiety, hallucinations   Physical Exam: Blood pressure 92/42, pulse 41, temperature 100.5 F (38.1 C), temperature source Rectal, resp. rate 21, SpO2 96.00%. Constitutional:   Patient is a well-developed and well-nourished *female in no acute distress and cooperative with exam. Head: Normocephalic and atraumatic Mouth: Mucus membranes moist Eyes: PERRL, EOMI, conjunctivae normal Neck: Supple, No Thyromegaly Cardiovascular: RRR, S1 normal, S2 normal Pulmonary/Chest: bibasilar crackles  Abdominal: Soft. Non-tender, non-distended, bowel sounds are normal, no masses, organomegaly, or guarding present.  Neurological: A&O x3, Strenght is normal and symmetric bilaterally, cranial nerve II-XII are grossly intact, no focal motor deficit, sensory intact to light touch bilaterally.  Extremities : trace edema of the lower extremities    Labs on Admission:  Results for orders placed during the hospital encounter of 01/31/13 (from the past 48 hour(s))  CBC WITH DIFFERENTIAL     Status: Abnormal   Collection Time    01/31/13  6:25 PM      Result Value Range   WBC 3.7 (*) 4.0 - 10.5 K/uL   RBC 4.20  3.87 - 5.11 MIL/uL   Hemoglobin 11.5 (*) 12.0 - 15.0 g/dL   HCT 35.1 (*) 36.0 - 46.0 %   MCV 83.6  78.0 - 100.0 fL   MCH 27.4  26.0 - 34.0 pg   MCHC 32.8  30.0 - 36.0 g/dL   RDW 15.2  11.5 - 15.5 %   Platelets 203  150 - 400 K/uL   Neutrophils Relative % 82 (*) 43 - 77 %   Neutro Abs 3.0  1.7 - 7.7 K/uL   Lymphocytes Relative 13  12 - 46 %   Lymphs Abs 0.5 (*) 0.7 - 4.0 K/uL   Monocytes Relative 5  3 - 12 %   Monocytes Absolute 0.2  0.1 - 1.0 K/uL   Eosinophils Relative 1  0 - 5 %   Eosinophils Absolute 0.0  0.0 - 0.7 K/uL   Basophils Relative 0  0 - 1 %   Basophils Absolute 0.0  0.0 - 0.1 K/uL  COMPREHENSIVE METABOLIC PANEL     Status: Abnormal   Collection Time    01/31/13  6:25 PM      Result Value Range   Sodium  137  137 - 147 mEq/L   Potassium 4.1  3.7 - 5.3 mEq/L   Chloride 95 (*) 96 - 112 mEq/L   CO2 28  19 - 32 mEq/L   Glucose, Bld 236 (*) 70 - 99 mg/dL   BUN 25 (*) 6 - 23 mg/dL   Creatinine, Ser 1.08  0.50 - 1.10 mg/dL   Calcium 10.1  8.4 - 10.5 mg/dL   Total Protein 7.7  6.0 - 8.3 g/dL   Albumin 3.3 (*) 3.5 - 5.2 g/dL   AST 17  0 - 37 U/L   ALT 10  0 -  35 U/L   Alkaline Phosphatase 84  39 - 117 U/L   Total Bilirubin 0.6  0.3 - 1.2 mg/dL   GFR calc non Af Amer 47 (*) >90 mL/min   GFR calc Af Amer 55 (*) >90 mL/min   Comment: (NOTE)     The eGFR has been calculated using the CKD EPI equation.     This calculation has not been validated in all clinical situations.     eGFR's persistently <90 mL/min signify possible Chronic Kidney     Disease.  D-DIMER, QUANTITATIVE     Status: Abnormal   Collection Time    01/31/13  6:25 PM      Result Value Range   D-Dimer, Quant 2.83 (*) 0.00 - 0.48 ug/mL-FEU   Comment:            AT THE INHOUSE ESTABLISHED CUTOFF     VALUE OF 0.48 ug/mL FEU,     THIS ASSAY HAS BEEN DOCUMENTED     IN THE LITERATURE TO HAVE     A SENSITIVITY AND NEGATIVE     PREDICTIVE VALUE OF AT LEAST     98 TO 99%.  THE TEST RESULT     SHOULD BE CORRELATED WITH     AN ASSESSMENT OF THE CLINICAL     PROBABILITY OF DVT / VTE.  URINALYSIS, ROUTINE W REFLEX MICROSCOPIC     Status: Abnormal   Collection Time    01/31/13  6:30 PM      Result Value Range   Color, Urine YELLOW  YELLOW   APPearance CLOUDY (*) CLEAR   Specific Gravity, Urine 1.015  1.005 - 1.030   pH 5.5  5.0 - 8.0   Glucose, UA NEGATIVE  NEGATIVE mg/dL   Hgb urine dipstick SMALL (*) NEGATIVE   Bilirubin Urine NEGATIVE  NEGATIVE   Ketones, ur NEGATIVE  NEGATIVE mg/dL   Protein, ur 949 (*) NEGATIVE mg/dL   Urobilinogen, UA 0.2  0.0 - 1.0 mg/dL   Nitrite POSITIVE (*) NEGATIVE   Leukocytes, UA TRACE (*) NEGATIVE  URINE MICROSCOPIC-ADD ON     Status: Abnormal   Collection Time    01/31/13  6:30 PM      Result  Value Range   WBC, UA 21-50  <3 WBC/hpf   Bacteria, UA MANY (*) RARE  TROPONIN I     Status: None   Collection Time    01/31/13 11:14 PM      Result Value Range   Troponin I <0.30  <0.30 ng/mL   Comment:            Due to the release kinetics of cTnI,     a negative result within the first hours     of the onset of symptoms does not rule out     myocardial infarction with certainty.     If myocardial infarction is still suspected,     repeat the test at appropriate intervals.  PRO B NATRIURETIC PEPTIDE     Status: Abnormal   Collection Time    01/31/13 11:14 PM      Result Value Range   Pro B Natriuretic peptide (BNP) 12081.0 (*) 0 - 450 pg/mL    Radiological Exams on Admission: Dg Chest 1 View  01/31/2013   CLINICAL DATA:  Shortness of breath, CHF  EXAM: CHEST - 1 VIEW  COMPARISON:  01/17/2013  FINDINGS: Rotated to the left.  Enlargement of cardiac silhouette.  Atherosclerotic calcification aortic arch.  Slight pulmonary  vascular congestion.  Interstitial infiltrates likely pulmonary edema, is improved from previous exam.  Increased bibasilar atelectasis and again noted small bibasilar effusions.  No pneumothorax.  Bones demineralized with orthopedic hardware at the proximal left humerus.  IMPRESSION: Minimal CHF improved from previous exam.  Bibasilar atelectasis and small pleural effusions.   Electronically Signed   By: Lavonia Dana M.D.   On: 01/31/2013 17:21   Ct Angio Chest Pe W/cm &/or Wo Cm  01/31/2013   CLINICAL DATA:  History of renal transplant, shortness of breath.  EXAM: CT ANGIOGRAPHY CHEST WITH CONTRAST  TECHNIQUE: Multidetector CT imaging of the chest was performed using the standard protocol during bolus administration of intravenous contrast. Multiplanar CT image reconstructions including MIPs were obtained to evaluate the vascular anatomy.  CONTRAST:  53mL OMNIPAQUE IOHEXOL 350 MG/ML SOLN  COMPARISON:  01/31/2013 radiograph, 01/15/2013 abdomen CT  FINDINGS: The pulmonary  arterial vessels are patent.  Mild ectasia of the ascending aorta up to 3.6 cm, tapering along the articular normal caliber. There is scattered atherosclerotic disease of the aorta and branch vessels.  Mild cardiac enlargement. Coronary artery and aortic valvular calcifications. Trace pericardial fluid and/or thickening.  Moderate to large bilateral pleural effusions. Associated airspace consolidations.  Reactive sized intrathoracic lymph nodes. Heterogeneous thyroid gland with multiple small nodules.  Upper abdominal images show a 3.1 cm peripherally calcified ovoid lesion in the region of the left adrenal gland, indeterminate however unchanged from the recent prior.  Interlobular septal thickening and hazy airspace opacities. Linear opacities within the middle lobe and lingula, favored to reflect atelectasis.  Multilevel degenerative changes, flowing anterior osteophytes. No acute osseous finding.  Review of the MIP images confirms the above findings.  IMPRESSION: No pulmonary embolism.  Pulmonary edema/ CHF pattern.  Moderate to large bilateral pleural effusions with associated airspace consolidations; atelectasis versus pneumonia.   Electronically Signed   By: Carlos Levering M.D.   On: 01/31/2013 22:55   US Venous Img Lower Unilateral Right  01/31/2013   CLINICAL DATA:  Right lower extremity pain.  EXAM: RIGHT LOWER EXTREMITY VENOUS DOPPLER ULTRASOUND  TECHNIQUE: Gray-scale sonography with graded compression, as well as color Doppler and duplex ultrasound, were performed to evaluate the deep venous system from the level of the common femoral vein through the popliteal and proximal calf veins. Spectral Doppler was utilized to evaluate flow at rest and with distal augmentation maneuvers.  COMPARISON:  US VENOUS IMG LOWER  BILATERAL dated 01/15/2013  FINDINGS: Thrombus within deep veins: Nonocclusive thrombus is again identified in the femoral vein at the level of the mid to distal thigh. There does appear to  be some progression since the prior ultrasound study with some nonocclusive thrombus now also evident in the popliteal vein, which is new.  Duplex waveform respiratory phasicity:  Normal.  Duplex waveform response to augmentation:  Normal.  Venous reflux:  None visualized.  Other findings: No evidence of superficial thrombophlebitis or abnormal fluid collection.  IMPRESSION: Nonocclusive thrombus in the femoral and popliteal vein of the right lower extremity. This appears progressive since the prior study on 01/15/2013 and there likely is a component of acute DVT which may have propagated from a segment of more chronic appearing thrombus previously.   Electronically Signed   By: Aletta Edouard M.D.   On: 01/31/2013 15:59    Assessment/Plan Principal Problem:   Atrial fibrillation with RVR Active Problems:   HYPERTENSION   Sepsis secondary to UTI   Acute diastolic heart failure   Pneumonia  Atrial fibrillation with rapid ventricular response   DVT (deep venous thrombosis)   DM (diabetes mellitus)   A. fib with RVR Patient has a new onset A. fib with RVR, will continue with Cardizem drip and start heparin protocol. We'll get carotid consultation in the morning. We'll continue with the Lopressor she was taking at home, will hold Lopressor if systolic blood pressure falls less than 90 mm of mercury.  DVT Patient had ultrasound of the lower extremities done today which showed nonocclusive thrombus of the femoral and popliteal vein of the right with a complement of new DVT. We'll continue heparin protocol.  Acute diastolic heart failure Patient has elevated BNP, we'll continue Lasix 80 mg by mouth daily. Will avoid IV Lasix at this time as patient blood pressure is soft of this she was started on Cardizem drip.  UTI Patient has abnormal UA, will start her on cefepime await urine culture results.  ? HCAP C. the MG shows infiltrate versus atelectasis, will initiate vancomycin and cefepime if  patient clinically improves consider switching to oral antibiotics next few days.  Status post renal transplant Continued patient's immunosuppressants  Diabetes mellitus We'll start the patient on sliding-scale insulin  Code status: patient is DO NOT RESUSCITATE  Family discussion: discussed with patient's daughter at bedside   Time Spent on Admission:  26 minutes  LAMA,GAGAN S [facility] Pager: [phone] 02/01/2013, 12:29 AM  If 7PM-7AM, please contact night-coverage  [email or web address]  Password [email or web address]

## 2013-02-01 NOTE — Progress Notes (Signed)
UR chart review completed.  

## 2013-02-01 NOTE — Progress Notes (Signed)
Pt admitted from ED around 0330 on cardizem drip at a rate of 20 mL/hr and HR sustained in 130s. BP systolic in the 56'L. Paged MD. Received orders to stop cardizem and give a 500 cc NS bolus. BP increased to 893'T systolic. Orders to go ahead and give scheduled lopressor in hopes that HR would decrease. HR decreased to 120's. MD notified. Told to leave the cardizem off and continue to monitor.

## 2013-02-01 NOTE — Progress Notes (Signed)
MD notified of WBC count of 1.3. No orders received at this time. Will continue to monitor.

## 2013-02-01 NOTE — Progress Notes (Signed)
MEDICATION RELATED CONSULT NOTE - INITIAL   Pharmacy Consult for Digoxin Indication: Afib with uncontrolled rate  Allergies  Allergen Reactions  . Codeine     REACTION: UNKNOWN REACTION  . Penicillins    Patient Measurements: Height: 5\' 8"  (172.7 cm) Weight: 158 lb 11.7 oz (72 kg) IBW/kg (Calculated) : 63.9  Vital Signs: Temp: 98 F (36.7 C) (01/27 0730) Temp src: Axillary (01/27 0730) BP: 123/67 mmHg (01/27 0900) Pulse Rate: 130 (01/27 0328) Intake/Output from previous day: 01/26 0701 - 01/27 0700 In: 150 [IV Piggyback:150] Out: 175 [Urine:175] Intake/Output from this shift: Total I/O In: 480 [P.O.:480] Out: -   Labs:  Recent Labs  01/31/13 1825 02/01/13 0424  WBC 3.7* 1.3*  HGB 11.5* 10.1*  HCT 35.1* 31.5*  PLT 203 175  CREATININE 1.08 1.17*  ALBUMIN 3.3* 2.6*  PROT 7.7 6.3  AST 17 12  ALT 10 8  ALKPHOS 84 70  BILITOT 0.6 0.4   Estimated Creatinine Clearance: 38.7 ml/min (by C-G formula based on Cr of 1.17).  Microbiology: Recent Results (from the past 720 hour(s))  URINE CULTURE     Status: None   Collection Time    01/15/13  5:20 AM      Result Value Range Status   Specimen Description URINE, CATHETERIZED   Final   Special Requests NONE   Final   Culture  Setup Time     Final   Value: 01/15/2013 19:21     Performed at [place]     Final   Value: >=100,000 COLONIES/ML     Performed at [facility]   Culture     Final   Value: KLEBSIELLA PNEUMONIAE     Performed at [facility]   Report Status 01/18/2013 FINAL   Final   Organism ID, Bacteria KLEBSIELLA PNEUMONIAE   Final  CULTURE, BLOOD (ROUTINE X 2)     Status: None   Collection Time    01/15/13  5:54 AM      Result Value Range Status   Specimen Description BLOOD LEFT HAND   Final   Special Requests     Final   Value: BOTTLES DRAWN AEROBIC AND ANAEROBIC 6CC EACH BOTTLE   Culture NO GROWTH 5 DAYS   Final   Report Status 01/20/2013 FINAL   Final   CULTURE, BLOOD (ROUTINE X 2)     Status: None   Collection Time    01/15/13  6:01 AM      Result Value Range Status   Specimen Description BLOOD LEFT ARM   Final   Special Requests BOTTLES DRAWN AEROBIC ONLY 6CC BOTTLE   Final   Culture NO GROWTH 5 DAYS   Final   Report Status 01/20/2013 FINAL   Final  MRSA PCR SCREENING     Status: None   Collection Time    01/15/13  9:50 AM      Result Value Range Status   MRSA by PCR NEGATIVE  NEGATIVE Final   Comment:            The GeneXpert MRSA Assay (FDA     approved for NASAL specimens     only), is one component of a     comprehensive MRSA colonization     surveillance program. It is not     intended to diagnose MRSA     infection nor to guide or     monitor treatment for     MRSA infections.  CLOSTRIDIUM DIFFICILE BY PCR     Status: None   Collection Time    01/19/13  3:46 PM      Result Value Range Status   C difficile by pcr NEGATIVE  NEGATIVE Final   Medical History: Past Medical History  Diagnosis Date  . ESRD (end stage renal disease)     etiology unknown, history of dialysis  . Shingles     October 2014  . Diabetes mellitus without complication    Medications:  Prescriptions prior to admission  Medication Sig Dispense Refill  . ALPRAZolam (XANAX) 1 MG tablet Take 1 tablet (1 mg total) by mouth at bedtime.  30 tablet  0  . amLODipine (NORVASC) 10 MG tablet Take 10 mg by mouth daily.      . calcitRIOL (ROCALTROL) 0.25 MCG capsule Take 0.25 mcg by mouth every Monday, Wednesday, and Friday.      . calcium carbonate (OS-CAL) 600 MG TABS tablet Take 600 mg by mouth 2 (two) times daily with a meal.      . furosemide (LASIX) 40 MG tablet Take 80 mg by mouth.      . gabapentin (NEURONTIN) 100 MG capsule Take 100 mg by mouth at bedtime.      Marland Kitchen glimepiride (AMARYL) 1 MG tablet Take 1 mg by mouth daily with supper.      . metoprolol (LOPRESSOR) 50 MG tablet Take 75 mg by mouth 2 (two) times daily.      . Multiple Vitamin  (MULTIVITAMIN) tablet Take 1 tablet by mouth daily.      . sirolimus (RAPAMUNE) 1 MG tablet Take 1 mg by mouth daily.      . tacrolimus (PROGRAF) 0.5 MG capsule Take 0.5 mg by mouth 2 (two) times daily.      . tacrolimus (PROGRAF) 1 MG capsule Take 1 mg by mouth 2 (two) times daily.      . diphenhydramine-acetaminophen (TYLENOL PM) 25-500 MG TABS Take 2 tablets by mouth at bedtime as needed (For sleep).       Assessment: 80yo female with h/o ESRD s/p kidney transplant 2008 on sirolimus and tacrolimus.  Pt being followed at [facility].  Now admitted for UTI, bacteremia, sepsis, CHF, DVT, and Afib with uncontrolled rate.  Estimated Creatinine Clearance: 38.7 ml/min (by C-G formula based on Cr of 1.17). Discussed with Arnold Long NP to initiate Digoxin in this setting.  No reported interactions with digoxin and anti-rejection meds.  Goal of Therapy:  Give digoxin loading dose of 0.75mg  total IV today (0.5mg  IV x 1 followed by 0.25mg  IV x 1 six hours later) then Digoxin 0.125mg  PO daily in attempt to control rate.  Predicted steady state digoxin level of 1.4 - 1.6.  Plan:  Digoxin dosing as above. Check Digoxin level at steady state or if suspected toxicity Monitor HR and renal fxn  [place] Crane, Anaria Kroner A 02/01/2013,11:58 AM

## 2013-02-02 DIAGNOSIS — J9600 Acute respiratory failure, unspecified whether with hypoxia or hypercapnia: Secondary | ICD-10-CM

## 2013-02-02 LAB — URINE CULTURE: Colony Count: >=100000

## 2013-02-02 LAB — GLUCOSE, CAPILLARY
GLUCOSE-CAPILLARY: 202 mg/dL — AB (ref 70–99)
Glucose-Capillary: 228 mg/dL — ABNORMAL HIGH (ref 70–99)
Glucose-Capillary: 140 mg/dL — ABNORMAL HIGH (ref 70–99)
Glucose-Capillary: 151 mg/dL — ABNORMAL HIGH (ref 70–99)

## 2013-02-02 LAB — HEMOGLOBIN A1C
HEMOGLOBIN A1C: 7.1 % — AB (ref <5.7)
Hgb A1c MFr Bld: 7.1 % — ABNORMAL HIGH (ref <5.7)
MEAN PLASMA GLUCOSE: 157 mg/dL — AB (ref <117)
Mean Plasma Glucose: 157 mg/dL — ABNORMAL HIGH (ref <117)

## 2013-02-02 LAB — CBC
HEMATOCRIT: 31.8 % — AB (ref 36.0–46.0)
Hemoglobin: 10.3 g/dL — ABNORMAL LOW (ref 12.0–15.0)
MCH: 27.1 pg (ref 26.0–34.0)
MCHC: 32.4 g/dL (ref 30.0–36.0)
MCV: 83.7 fL (ref 78.0–100.0)
Platelets: 200 10*3/uL (ref 150–400)
RBC: 3.8 MIL/uL — ABNORMAL LOW (ref 3.87–5.11)
RDW: 15.1 % (ref 11.5–15.5)
WBC: 2.7 10*3/uL — AB (ref 4.0–10.5)

## 2013-02-02 LAB — STREP PNEUMONIAE URINARY ANTIGEN: Strep Pneumo Urinary Antigen: NEGATIVE

## 2013-02-02 LAB — BASIC METABOLIC PANEL
BUN: 43 mg/dL — AB (ref 6–23)
CHLORIDE: 98 meq/L (ref 96–112)
CO2: 25 mEq/L (ref 19–32)
CREATININE: 1.4 mg/dL — AB (ref 0.50–1.10)
Calcium: 9.2 mg/dL (ref 8.4–10.5)
GFR, EST AFRICAN AMERICAN: 40 mL/min — AB (ref >90)
GFR, EST NON AFRICAN AMERICAN: 34 mL/min — AB (ref >90)
Glucose, Bld: 229 mg/dL — ABNORMAL HIGH (ref 70–99)
Potassium: 4.1 mEq/L (ref 3.7–5.3)
Sodium: 136 mEq/L — ABNORMAL LOW (ref 137–147)

## 2013-02-02 LAB — PROTIME-INR
INR: 1.17 (ref 0.00–1.49)
PROTHROMBIN TIME: 14.7 s (ref 11.6–15.2)

## 2013-02-02 LAB — HEPARIN LEVEL (UNFRACTIONATED): HEPARIN UNFRACTIONATED: 0.21 [IU]/mL — AB (ref 0.30–0.70)

## 2013-02-02 MED ORDER — INSULIN ASPART 100 UNIT/ML ~~LOC~~ SOLN: 0.0000 [IU] | Freq: Every day | SUBCUTANEOUS | Status: DC

## 2013-02-02 MED ORDER — ENOXAPARIN (LOVENOX) PATIENT EDUCATION KIT
PACK | Freq: Once | Status: AC
  Administered 2013-02-02: 14:00:00
  Filled 2013-02-02: qty 1

## 2013-02-02 MED ORDER — GUAIFENESIN 100 MG/5ML PO SOLN
5.0000 mL | ORAL | Status: DC | PRN
  Administered 2013-02-03: 100 mg via ORAL
  Filled 2013-02-02: qty 5

## 2013-02-02 MED ORDER — COUMADIN BOOK
Freq: Once | Status: AC
  Administered 2013-02-02: 08:00:00
  Filled 2013-02-02: qty 1

## 2013-02-02 MED ORDER — IPRATROPIUM BROMIDE 0.02 % IN SOLN
0.5000 mg | Freq: Four times a day (QID) | RESPIRATORY_TRACT | Status: DC
  Administered 2013-02-02 – 2013-02-03 (×3): 0.5 mg via RESPIRATORY_TRACT
  Filled 2013-02-02 (×3): qty 2.5

## 2013-02-02 MED ORDER — METOPROLOL TARTRATE 25 MG PO TABS
75.0000 mg | ORAL_TABLET | Freq: Two times a day (BID) | ORAL | Status: DC
  Administered 2013-02-02 – 2013-02-03 (×2): 75 mg via ORAL
  Filled 2013-02-02 (×2): qty 3

## 2013-02-02 MED ORDER — LEVALBUTEROL HCL 0.63 MG/3ML IN NEBU
0.6300 mg | INHALATION_SOLUTION | Freq: Four times a day (QID) | RESPIRATORY_TRACT | Status: DC
Start: 2013-02-02 — End: 2013-02-03
  Administered 2013-02-02 – 2013-02-03 (×3): 0.63 mg via RESPIRATORY_TRACT
  Filled 2013-02-02 (×3): qty 3

## 2013-02-02 MED ORDER — WARFARIN SODIUM 2 MG PO TABS
3.0000 mg | ORAL_TABLET | Freq: Once | ORAL | Status: AC
  Administered 2013-02-02: 3 mg via ORAL
  Filled 2013-02-02 (×2): qty 1

## 2013-02-02 MED ORDER — DEXTROSE 5 % IV SOLN
1.0000 g | INTRAVENOUS | Status: DC
  Administered 2013-02-02 – 2013-02-07 (×4): 1 g via INTRAVENOUS
  Filled 2013-02-02 (×6): qty 1

## 2013-02-02 MED ORDER — WARFARIN VIDEO
Freq: Once | Status: AC
  Administered 2013-02-02: 08:00:00

## 2013-02-02 MED ORDER — ENOXAPARIN SODIUM 80 MG/0.8ML ~~LOC~~ SOLN
75.0000 mg | SUBCUTANEOUS | Status: DC
  Administered 2013-02-02 – 2013-02-03 (×2): 75 mg via SUBCUTANEOUS
  Filled 2013-02-02 (×2): qty 0.8

## 2013-02-02 MED ORDER — INSULIN ASPART 100 UNIT/ML ~~LOC~~ SOLN
0.0000 [IU] | Freq: Three times a day (TID) | SUBCUTANEOUS | Status: DC
  Administered 2013-02-02: 2 [IU] via SUBCUTANEOUS
  Administered 2013-02-03: 8 [IU] via SUBCUTANEOUS
  Administered 2013-02-03 – 2013-02-04 (×3): 3 [IU] via SUBCUTANEOUS
  Administered 2013-02-05 – 2013-02-06 (×3): 5 [IU] via SUBCUTANEOUS

## 2013-02-02 NOTE — Progress Notes (Signed)
[place] for Vancomycin & Cefepime Indication: pneumonia  Allergies  Allergen Reactions  . Codeine     REACTION: UNKNOWN REACTION  . Penicillins     Patient Measurements: Height: 5\' 8"  (172.7 cm) Weight: 158 lb 11.7 oz (72 kg) IBW/kg (Calculated) : 63.9  Vital Signs: Temp: 98.5 F (36.9 C) (01/28 0400) Temp src: Axillary (01/28 0000) BP: 151/45 mmHg (01/28 0600) Pulse Rate: 65 (01/27 2213) Intake/Output from previous day: 01/27 0701 - 01/28 0700 In: 5462 [P.O.:1200; I.V.:231; IV Piggyback:300] Out: 1301 [Urine:1300; Stool:1] Intake/Output from this shift:    Labs:  Recent Labs  01/31/13 1825 02/01/13 0424 02/02/13 0514  WBC 3.7* 1.3* 2.7*  HGB 11.5* 10.1* 10.3*  PLT 203 175 200  CREATININE 1.08 1.17* 1.40*   Estimated Creatinine Clearance: 32.3 ml/min (by C-G formula based on Cr of 1.4). No results found for this basename: VANCOTROUGH, Corlis Leak, VANCORANDOM, [place], GENTPEAK, [place], [place], TOBRAPEAK, TOBRARND, AMIKACINPEAK, AMIKACINTROU, AMIKACIN,  in the last 72 hours   Microbiology: Recent Results (from the past 720 hour(s))  URINE CULTURE     Status: None   Collection Time    01/15/13  5:20 AM      Result Value Range Status   Specimen Description URINE, CATHETERIZED   Final   Special Requests NONE   Final   Culture  Setup Time     Final   Value: 01/15/2013 19:21     Performed at [place]     Final   Value: >=100,000 COLONIES/ML     Performed at [facility]   Culture     Final   Value: KLEBSIELLA PNEUMONIAE     Performed at [facility]   Report Status 01/18/2013 FINAL   Final   Organism ID, Bacteria KLEBSIELLA PNEUMONIAE   Final  CULTURE, BLOOD (ROUTINE X 2)     Status: None   Collection Time    01/15/13  5:54 AM      Result Value Range Status   Specimen Description BLOOD LEFT HAND   Final   Special Requests     Final   Value: BOTTLES DRAWN AEROBIC AND  ANAEROBIC 6CC EACH BOTTLE   Culture NO GROWTH 5 DAYS   Final   Report Status 01/20/2013 FINAL   Final  CULTURE, BLOOD (ROUTINE X 2)     Status: None   Collection Time    01/15/13  6:01 AM      Result Value Range Status   Specimen Description BLOOD LEFT ARM   Final   Special Requests BOTTLES DRAWN AEROBIC ONLY [place]   Final   Culture NO GROWTH 5 DAYS   Final   Report Status 01/20/2013 FINAL   Final  MRSA PCR SCREENING     Status: None   Collection Time    01/15/13  9:50 AM      Result Value Range Status   MRSA by PCR NEGATIVE  NEGATIVE Final   Comment:            The GeneXpert MRSA Assay (FDA     approved for NASAL specimens     only), is one component of a     comprehensive MRSA colonization     surveillance program. It is not     intended to diagnose MRSA     infection nor to guide or     monitor treatment for     MRSA infections.  CLOSTRIDIUM DIFFICILE BY  PCR     Status: None   Collection Time    01/19/13  3:46 PM      Result Value Range Status   C difficile by pcr NEGATIVE  NEGATIVE Final  URINE CULTURE     Status: None   Collection Time    01/31/13  7:30 PM      Result Value Range Status   Specimen Description URINE, CATHETERIZED   Final   Special Requests NONE   Final   Culture  Setup Time     Final   Value: 01/31/2013 22:30     Performed at [place]     Final   Value: >=100,000 COLONIES/ML     Performed at [facility]   Culture     Final   Value: [place]     Performed at [facility]   Report Status PENDING   Incomplete    Medical History: Past Medical History  Diagnosis Date  . ESRD (end stage renal disease)     etiology unknown, history of dialysis  . Shingles     October 2014  . Diabetes mellitus without complication     Medications:  Scheduled:  . ALPRAZolam  1 mg Oral QHS  . calcitRIOL  0.25 mcg Oral Q M,W,F  . calcium carbonate  1 tablet Oral BID WC  . ceFEPime (MAXIPIME) IV  1 g  Intravenous Q12H  . coumadin book   Does not apply Once  . digoxin  0.25 mg Intravenous Once  . digoxin  0.125 mg Oral Daily  . furosemide  40 mg Intravenous Q12H  . gabapentin  100 mg Oral QHS  . insulin aspart  0-9 Units Subcutaneous TID WC  . metoprolol  75 mg Oral BID  . sirolimus  1 mg Oral Daily  . sodium chloride  3 mL Intravenous Q12H  . tacrolimus  1.5 mg Oral BID  . vancomycin  1,000 mg Intravenous Q24H  . warfarin  3 mg Oral Once  . warfarin   Does not apply Once  . Warfarin - Pharmacist Dosing Inpatient   Does not apply Q24H   Assessment: 80 yo F admitted from SNF with PNA and UTI.   She is neutropenic.  She is on immunosuppressants for history of renal transplant.   Her Scr is increasing.  I/O not adequately recorded.   Cipro 1/26 x 1 dose  Cefepime 1/27 >>  Vancomycin 1/27 >>  Goal of Therapy:  Vancomycin trough level 15-20 mcg/ml  Plan:  Decrease Cefepime 1gm IV q24h Continue Vancomycin 1gm IV q24h Check Vancomycin trough at steady state Monitor renal function, cx data, & clinical progress.   Biagio Borg 02/02/2013,8:01 AM

## 2013-02-02 NOTE — Progress Notes (Signed)
Report given to Morene Antu, LPN. Patient transferred to 308 via bed in stable condition.

## 2013-02-02 NOTE — Progress Notes (Signed)
Inpatient Diabetes Program Recommendations  AACE/ADA: New Consensus Statement on Inpatient Glycemic Control (2013)  Target Ranges:  Prepandial:   less than 140 mg/dL      Peak postprandial:   less than 180 mg/dL (1-2 hours)      Critically ill patients:  140 - 180 mg/dL   Results for TAGAN, BARTRAM (MRN [id number]) as of 02/02/2013 11:35  Ref. Range 01/31/2013 07:31 01/31/2013 13:11 02/01/2013 12:46 02/01/2013 16:26 02/01/2013 22:22 02/02/2013 08:11  Glucose-Capillary Latest Range: 70-99 mg/dL 180 (H) 169 (H) 402 (H) 265 (H) 193 (H) 228 (H)    Diabetes history: DM2 Outpatient Diabetes medications: Amaryl 1mg  daily Current orders for Inpatient glycemic control: Novolog 0-9 units TID with meals  Inpatient Diabetes Program Recommendations Correction (SSI): Please increase Novolog correction to moderate scale and add Novolog bedtime correction scale.  Note: Noted patient was given a one time dose of Solumedrol 125 mg on 1/26 @ 18:26 which is likely cause of elevation in blood glucose.  Please increase Novolog correction scale and add bedtime correction scale.  Thanks, Barnie Alderman, RN, MSN, CCRN Diabetes Coordinator Inpatient Diabetes Program [phone] (Team Pager) [phone] ([facility] office) [phone] [facility] office)

## 2013-02-02 NOTE — Progress Notes (Signed)
Patient ID: [name], female   DOB: Dec 17, 1930, 80 y.o.   MRN: [id number]     Primary cardiologist:  Subjective:    No complaints overnight   Objective:   Temp:  [97.6 F (36.4 C)-98.6 F (37 C)] 98.2 F (36.8 C) (01/28 0800) Pulse Rate:  [65] 65 (01/27 2213) Resp:  [14-26] 21 (01/28 0600) BP: (116-161)/(41-72) 151/45 mmHg (01/28 0600) Last BM Date: 02/01/13  Filed Weights   02/01/13 0300 02/01/13 0500  Weight: 155 lb 3.3 oz (70.4 kg) 158 lb 11.7 oz (72 kg)    Intake/Output Summary (Last 24 hours) at 02/02/13 0904 Last data filed at 02/02/13 0600  Gross per 24 hour  Intake   1240 ml  Output   1101 ml  Net    139 ml    Telemetry: sinus rhythm rate 60  Exam:  General:NAD  Resp: CTAB  Cardiac: RRR, no m/r/g, no JVD  GI: abdomen soft, NT, ND  MSK: extremities are warm, no edema  Neuro: no focal deficits  Lab Results:  Basic Metabolic Panel:  Recent Labs Lab 01/31/13 1825 02/01/13 0424 02/02/13 0514  NA 137 136* 136*  K 4.1 4.3 4.1  CL 95* 95* 98  CO2 28 24 25   GLUCOSE 236* 407* 229*  BUN 25* 29* 43*  CREATININE 1.08 1.17* 1.40*  CALCIUM 10.1 9.0 9.2    Liver Function Tests:  Recent Labs Lab 01/31/13 1825 02/01/13 0424  AST 17 12  ALT 10 8  ALKPHOS 84 70  BILITOT 0.6 0.4  PROT 7.7 6.3  ALBUMIN 3.3* 2.6*    CBC:  Recent Labs Lab 01/31/13 1825 02/01/13 0424 02/02/13 0514  WBC 3.7* 1.3* 2.7*  HGB 11.5* 10.1* 10.3*  HCT 35.1* 31.5* 31.8*  MCV 83.6 83.6 83.7  PLT 203 175 200    Cardiac Enzymes:  Recent Labs Lab 01/31/13 2314  TROPONINI <0.30    BNP:  Recent Labs  01/15/13 0516 01/31/13 2314  PROBNP 13226.0* 12081.0*    Coagulation:  Recent Labs Lab 02/02/13 0514  INR 1.17    ECG:   Medications:   Scheduled Medications: . ALPRAZolam  1 mg Oral QHS  . calcitRIOL  0.25 mcg Oral Q M,W,F  . calcium carbonate  1 tablet Oral BID WC  . ceFEPime (MAXIPIME) IV  1 g Intravenous Q24H  . coumadin book   Does  not apply Once  . digoxin  0.25 mg Intravenous Once  . digoxin  0.125 mg Oral Daily  . furosemide  40 mg Intravenous Q12H  . gabapentin  100 mg Oral QHS  . insulin aspart  0-9 Units Subcutaneous TID WC  . metoprolol  75 mg Oral BID  . sirolimus  1 mg Oral Daily  . sodium chloride  3 mL Intravenous Q12H  . tacrolimus  1.5 mg Oral BID  . vancomycin  1,000 mg Intravenous Q24H  . warfarin  3 mg Oral Once  . warfarin   Does not apply Once  . Warfarin - Pharmacist Dosing Inpatient   Does not apply Q24H     Infusions: . diltiazem (CARDIZEM) infusion Stopped (02/01/13 0242)  . heparin 1,100 Units/hr (02/02/13 0356)     PRN Medications:  sodium chloride, ondansetron (ZOFRAN) IV, ondansetron, sodium chloride     Assessment/Plan    1. New onset atrial fib: May be related to acute illness with UTI. She was placed on cardizem gtt with some BP softness, loaded with IV digoxin yesterday. Now in sinus rhythm with  normal rates - rates well controlled this morning on oral dilt and metop - she is on heparin for both DVT and afib, recommend transition to coumadin.   2. Acute/Chronic Diastolic CHF: - on IV lasix, Cr is trending up. Will hold today.   3. DVT:  - on heparin gtt, recommend transitioning to oral coumadin. Please discuss appropriate drug monitoring with pharmacy given her immunosuppresants and current abx.         Carlyle Dolly, M.D., F.A.C.C.

## 2013-02-02 NOTE — Progress Notes (Signed)
Patient ID: [name]  female  [id number]    DOB: [birth year]    DOA: 01/31/2013  PCP: [name]., MD  Assessment/Plan: Principal Problem: 1. Acute respiratory failure secondary to acute on chronic diastolic heart failure/ Health care associated pneumonia:  - continue IV vancomycin, IV cefepime, placed on xopenex and atrovent - Urine for streptococcal and legionella pending.  - Unfortunately blood cultures not done and sputum cultures ordered and pending.   2. New Onset Afib with RVR:  -Patient was started on Cardizem drip, currently off. On digoxin and metoprolol, cardiology following - Will discontinue IV heparin, place on Lovenox - Discussed with pharmacy and cardiology, NOAC agents are contraindicated with patient's with renal impairment and specially immunosuppressants per pharmacist. Will continue Coumadin.  3.LLE DVT:  - on IV heparin. Start Lovenox and provide Lovenox teaching. - coumadin as per pharmacy.   4. Mild acute renal insufficiency;  - probably from diuresis.   5. H/o Renal Transplant:  - resume immunosuppressants.   6. Klebsiella pneumonia UTI:  - Urine culture reviewed, continue cefepime  7. Diabetes Mellitus:  -- Obtain hemoglobin A 1C, placed on moderate sliding scale  8. Neutropenia; improving  DVT Prophylaxis: Lovenox and Coumadin  Code Status: DO NOT RESUSCITATE  Family Communication: Discussed with multiple family members, including daughter, [name] at the bedside  Disposition: Transfer to telemetry floor today    Subjective: Feels somewhat better, still coughing, heart rate regular in normal sinus rhythm, controlled  Objective: Weight change:   Intake/Output Summary (Last 24 hours) at 02/02/13 1252 Last data filed at 02/02/13 0600  Gross per 24 hour  Intake    717 ml  Output    901 ml  Net   -184 ml   Blood pressure 141/43, pulse 65, temperature 98.2 F (36.8 C), temperature source Oral, resp. rate 21, height 5\' 8"  (1.727  m), weight 72 kg (158 lb 11.7 oz), SpO2 95.00%.  Physical Exam: General: Alert and awake, oriented x3, not in any acute distress. CVS: S1-S2 clear, normal sinus rhythm Chest: Bilateral rhonchi and wheezing Abdomen: soft nontender, nondistended, normal bowel sounds  Extremities: no cyanosis, clubbing. 1+ edema L>R Neuro: Cranial nerves II-XII intact, no focal neurological deficits  Lab Results: Basic Metabolic Panel:  Recent Labs Lab 02/01/13 0424 02/02/13 0514  NA 136* 136*  K 4.3 4.1  CL 95* 98  CO2 24 25  GLUCOSE 407* 229*  BUN 29* 43*  CREATININE 1.17* 1.40*  CALCIUM 9.0 9.2   Liver Function Tests:  Recent Labs Lab 01/31/13 1825 02/01/13 0424  AST 17 12  ALT 10 8  ALKPHOS 84 70  BILITOT 0.6 0.4  PROT 7.7 6.3  ALBUMIN 3.3* 2.6*   No results found for this basename: LIPASE, AMYLASE,  in the last 168 hours No results found for this basename: AMMONIA,  in the last 168 hours CBC:  Recent Labs Lab 02/01/13 0424 02/02/13 0514  WBC 1.3* 2.7*  NEUTROABS 1.1*  --   HGB 10.1* 10.3*  HCT 31.5* 31.8*  MCV 83.6 83.7  PLT 175 200   Cardiac Enzymes:  Recent Labs Lab 01/31/13 2314  TROPONINI <0.30   BNP: No components found with this basename: POCBNP,  CBG:  Recent Labs Lab 02/01/13 1246 02/01/13 1626 02/01/13 2222 02/02/13 0811 02/02/13 1152  GLUCAP 402* 265* 193* 228* 202*     Micro Results: Recent Results (from the past 240 hour(s))  URINE CULTURE     Status: None   Collection Time  01/31/13  7:30 PM      Result Value Range Status   Specimen Description URINE, CATHETERIZED   Final   Special Requests NONE   Final   Culture  Setup Time     Final   Value: 01/31/2013 22:30     Performed at [facility]     Final   Value: >=100,000 COLONIES/ML     Performed at [facility]   Culture     Final   Value: [place]     Performed at [facility]   Report Status PENDING   Incomplete     Studies/Results: Ct Abdomen Pelvis Wo Contrast  01/15/2013   CLINICAL DATA:  Fall.  Altered mental status.  EXAM: CT ABDOMEN AND PELVIS WITHOUT CONTRAST  TECHNIQUE: Multidetector CT imaging of the abdomen and pelvis was performed following the standard protocol without intravenous contrast.  COMPARISON:  None.  FINDINGS: The patient has large bilateral pleural effusions. There is cardiomegaly. No pericardial effusion. Compressive atelectasis in lung bases is noted.  The right kidney is markedly atrophic with multiple cysts identified. There is a cystic structure in the left upper quadrant with dense rim calcification which may represent the patient's left kidney or adrenal gland. The left adrenal gland is not definitely visualized. There is a right lower quadrant renal transplant with 2-3 small lobules of gas the renal collecting system likely related to Foley catheterization. The patient is status post cholecystectomy. The liver, spleen and pancreas are unremarkable. Extensive atherosclerotic vascular disease is seen. The patient is status post hysterectomy. There is a trace amount of free pelvic fluid. The stomach and small and large bowel appear normal. Diffuse body wall edema is noted. The patient is status post hysterectomy. No lymphadenopathy is seen. No fracture is seen. No lytic or sclerotic bony lesion is seen with multilevel lumbar spondylosis noted.  IMPRESSION: Large appearing bilateral pleural effusions with associated compressive basilar atelectasis.  Cardiomegaly.  Trace amount of free pelvic fluid is nonspecific but could be due to IV fluid resuscitation or anasarca with diffuse body wall edema noted.  Tiny amount of air in the right lower quadrant renal transplant is likely related to Foley catheterization.   Electronically Signed   By: Inge Rise M.D.   On: 01/15/2013 05:52   Dg Chest 1 View  01/31/2013   CLINICAL DATA:  Shortness of breath, CHF  EXAM: CHEST - 1 VIEW  COMPARISON:   01/17/2013  FINDINGS: Rotated to the left.  Enlargement of cardiac silhouette.  Atherosclerotic calcification aortic arch.  Slight pulmonary vascular congestion.  Interstitial infiltrates likely pulmonary edema, is improved from previous exam.  Increased bibasilar atelectasis and again noted small bibasilar effusions.  No pneumothorax.  Bones demineralized with orthopedic hardware at the proximal left humerus.  IMPRESSION: Minimal CHF improved from previous exam.  Bibasilar atelectasis and small pleural effusions.   Electronically Signed   By: Lavonia Dana M.D.   On: 01/31/2013 17:21   Ct Head Wo Contrast  01/15/2013   CLINICAL DATA:  Altered mental status.  Status post fall.  EXAM: CT HEAD WITHOUT CONTRAST  CT CERVICAL SPINE WITHOUT CONTRAST  TECHNIQUE: Multidetector CT imaging of the head and cervical spine was performed following the standard protocol without intravenous contrast. Multiplanar CT image reconstructions of the cervical spine were also generated.  COMPARISON:  Cervical spine CT scan 02/28/2003. Brain MRI 07/23/2005.  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  Chronic microvascular ischemic change is seen. No evidence of acute abnormality including infarction, hemorrhage, mass lesion, mass effect, midline shift or abnormal extra-axial fluid collection. Mucosal thickening bilateral maxillary sinuses and is seen. There is some scattered ethmoid air cell disease. Prosthetic left globe is noted. The calvarium is intact.  CT CERVICAL SPINE FINDINGS  No fracture is identified. Mild anterolisthesis C4 on C7 and C7 on T1 due to facet arthropathy is noted. Anterior endplate spurring is seen. The patient has large appearing bilateral pleural effusions. Interlobular septal thickening in the apices is noted.  IMPRESSION: No acute finding head or cervical spine.  Chronic microvascular ischemic change and remote right parieto-occipital infarct.  Large appearing bilateral  pleural effusions and interlobular septal thickening compatible with pulmonary edema.   Electronically Signed   By: Inge Rise M.D.   On: 01/15/2013 05:46   Ct Angio Chest Pe W/cm &/or Wo Cm  01/31/2013   CLINICAL DATA:  History of renal transplant, shortness of breath.  EXAM: CT ANGIOGRAPHY CHEST WITH CONTRAST  TECHNIQUE: Multidetector CT imaging of the chest was performed using the standard protocol during bolus administration of intravenous contrast. Multiplanar CT image reconstructions including MIPs were obtained to evaluate the vascular anatomy.  CONTRAST:  66mL OMNIPAQUE IOHEXOL 350 MG/ML SOLN  COMPARISON:  01/31/2013 radiograph, 01/15/2013 abdomen CT  FINDINGS: The pulmonary arterial vessels are patent.  Mild ectasia of the ascending aorta up to 3.6 cm, tapering along the articular normal caliber. There is scattered atherosclerotic disease of the aorta and branch vessels.  Mild cardiac enlargement. Coronary artery and aortic valvular calcifications. Trace pericardial fluid and/or thickening.  Moderate to large bilateral pleural effusions. Associated airspace consolidations.  Reactive sized intrathoracic lymph nodes. Heterogeneous thyroid gland with multiple small nodules.  Upper abdominal images show a 3.1 cm peripherally calcified ovoid lesion in the region of the left adrenal gland, indeterminate however unchanged from the recent prior.  Interlobular septal thickening and hazy airspace opacities. Linear opacities within the middle lobe and lingula, favored to reflect atelectasis.  Multilevel degenerative changes, flowing anterior osteophytes. No acute osseous finding.  Review of the MIP images confirms the above findings.  IMPRESSION: No pulmonary embolism.  Pulmonary edema/ CHF pattern.  Moderate to large bilateral pleural effusions with associated airspace consolidations; atelectasis versus pneumonia.   Electronically Signed   By: Carlos Levering M.D.   On: 01/31/2013 22:55   Ct Cervical  Spine Wo Contrast  01/15/2013   CLINICAL DATA:  Altered mental status.  Status post fall.  EXAM: CT HEAD WITHOUT CONTRAST  CT CERVICAL SPINE WITHOUT CONTRAST  TECHNIQUE: Multidetector CT imaging of the head and cervical spine was performed following the standard protocol without intravenous contrast. Multiplanar CT image reconstructions of the cervical spine were also generated.  COMPARISON:  Cervical spine CT scan 02/28/2003. Brain MRI 07/23/2005.  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 Chronic microvascular ischemic change is seen. No evidence of acute abnormality including infarction, hemorrhage, mass lesion, mass effect, midline shift or abnormal extra-axial fluid collection. Mucosal thickening bilateral maxillary sinuses and is seen. There is some scattered ethmoid air cell disease. Prosthetic left globe is noted. The calvarium is intact.  CT CERVICAL SPINE FINDINGS  No fracture is identified. Mild anterolisthesis C4 on C7 and C7 on T1 due to facet arthropathy is noted. Anterior endplate spurring is seen. The patient has large appearing bilateral pleural effusions. Interlobular septal thickening in the apices is noted.  IMPRESSION: No acute finding head or  cervical spine.  Chronic microvascular ischemic change and remote right parieto-occipital infarct.  Large appearing bilateral pleural effusions and interlobular septal thickening compatible with pulmonary edema.   Electronically Signed   By: Inge Rise M.D.   On: 01/15/2013 05:46   US Venous Img Lower Bilateral  01/15/2013   CLINICAL DATA:  Bilateral lower extremity edema.  EXAM: BILATERAL LOWER EXTREMITY VENOUS DOPPLER ULTRASOUND  TECHNIQUE: Gray-scale sonography with graded compression, as well as color Doppler and duplex ultrasound, were performed to evaluate the deep venous system from the level of the common femoral vein through the popliteal and proximal calf veins. Spectral Doppler was utilized to  evaluate flow at rest and with distal augmentation maneuvers.  COMPARISON:  None.  FINDINGS: Thrombus within deep veins: No DVT is identified in the right lower extremity. The left femoral vein is partially compressible in the mid thigh and over a segment demonstrates evidence of nonocclusive mural thrombus. Part of this thrombus appears echogenic and this may represent chronic thrombus. No occlusive thrombus is identified in the left lower extremity.  Duplex waveform respiratory phasicity:  Normal.  Duplex waveform response to augmentation:  Normal.  Venous reflux:  None visualized.  Other findings: No evidence of superficial thrombophlebitis or abnormal fluid collection.  IMPRESSION: Nonocclusive mural thrombus in the femoral vein of the left thigh. Based on appearance, this may be chronic thrombus.   Electronically Signed   By: Aletta Edouard M.D.   On: 01/15/2013 11:21   US Venous Img Lower Unilateral Right  01/31/2013   CLINICAL DATA:  Right lower extremity pain.  EXAM: RIGHT LOWER EXTREMITY VENOUS DOPPLER ULTRASOUND  TECHNIQUE: Gray-scale sonography with graded compression, as well as color Doppler and duplex ultrasound, were performed to evaluate the deep venous system from the level of the common femoral vein through the popliteal and proximal calf veins. Spectral Doppler was utilized to evaluate flow at rest and with distal augmentation maneuvers.  COMPARISON:  US VENOUS IMG LOWER  BILATERAL dated 01/15/2013  FINDINGS: Thrombus within deep veins: Nonocclusive thrombus is again identified in the femoral vein at the level of the mid to distal thigh. There does appear to be some progression since the prior ultrasound study with some nonocclusive thrombus now also evident in the popliteal vein, which is new.  Duplex waveform respiratory phasicity:  Normal.  Duplex waveform response to augmentation:  Normal.  Venous reflux:  None visualized.  Other findings: No evidence of superficial thrombophlebitis or  abnormal fluid collection.  IMPRESSION: Nonocclusive thrombus in the femoral and popliteal vein of the right lower extremity. This appears progressive since the prior study on 01/15/2013 and there likely is a component of acute DVT which may have propagated from a segment of more chronic appearing thrombus previously.   Electronically Signed   By: Aletta Edouard M.D.   On: 01/31/2013 15:59   Dg Chest Port 1 View  01/17/2013   CLINICAL DATA:  CHF and bilateral pleural effusions.  EXAM: PORTABLE CHEST - 1 VIEW  COMPARISON:  01/16/2013  FINDINGS: Less prominent pleural effusion and lower lobe atelectasis on the left with residual atelectasis/ consolidation remaining as well as some remaining left pleural fluid. The right pleural effusion is relatively stable. Perihilar atelectasis on the right has improved. Interstitial edema is relatively stable.  IMPRESSION: Less prominent left pleural effusion and left lower lobe atelectasis. Decrease in right perihilar atelectasis.   Electronically Signed   By: Aletta Edouard M.D.   On: 01/17/2013 08:00   Dg  Chest Port 1 View  01/16/2013   CLINICAL DATA:  CHF  EXAM: PORTABLE CHEST - 1 VIEW  COMPARISON:  01/15/2013  FINDINGS: Heart remains enlarged with diffuse edema pattern throughout the lungs. Enlarging pleural effusions present. Increased right hilar and bibasilar atelectasis/ collapse. No pneumothorax. Previous left proximal humerus ORIF for a remote fracture.  IMPRESSION: Persistent CHF pattern.  Enlarging pleural effusions with worsening bibasilar compressive atelectasis/collapse.  Increased right perihilar atelectasis as well.   Electronically Signed   By: Daryll Brod M.D.   On: 01/16/2013 07:48   Dg Chest Port 1 View  01/15/2013   CLINICAL DATA:  Altered mental status.  Fall, weakness.  EXAM: PORTABLE CHEST - 1 VIEW  COMPARISON:  Plain film of the chest 05/06/2007.  FINDINGS: There is extensive bilateral airspace disease. Pleural effusions are identified and  appear greater on the right. Cardiomegaly is seen.  IMPRESSION: Appears the chest most compatible with congestive heart failure with associated pleural effusions and basilar atelectasis.   Electronically Signed   By: Inge Rise M.D.   On: 01/15/2013 05:05    Medications: Scheduled Meds: . ALPRAZolam  1 mg Oral QHS  . calcitRIOL  0.25 mcg Oral Q M,W,F  . calcium carbonate  1 tablet Oral BID WC  . ceFEPime (MAXIPIME) IV  1 g Intravenous Q24H  . digoxin  0.25 mg Intravenous Once  . digoxin  0.125 mg Oral Daily  . enoxaparin (LOVENOX) injection  75 mg Subcutaneous Q24H  . gabapentin  100 mg Oral QHS  . insulin aspart  0-9 Units Subcutaneous TID WC  . metoprolol  75 mg Oral BID  . sirolimus  1 mg Oral Daily  . sodium chloride  3 mL Intravenous Q12H  . tacrolimus  1.5 mg Oral BID  . vancomycin  1,000 mg Intravenous Q24H  . warfarin  3 mg Oral Once  . warfarin   Does not apply Once  . Warfarin - Pharmacist Dosing Inpatient   Does not apply Q24H      LOS: 2 days   RAI,RIPUDEEP M.D. [facility] 02/02/2013, 12:52 PM Pager: [phone]  If 7PM-7AM, please contact night-coverage [email or web address] Password [email or web address]

## 2013-02-02 NOTE — Progress Notes (Signed)
Was asked by Drs Harl Bowie & Rai to evaluate patient's medication regimen for possible drug interactions with new oral anticoagulants.    All newer oral anticoagulants (Xarelto, Apixaban, Pradaxa) are P-glycoprotein/ABCB1 Inhibitors which can increase serum concentration of Xarelto and therefore increase risk of bleeding especially in patients with renal impairment.  Given this patient's estimated CrCl of 30-35 ml/min would avoid these medications.   Netta Cedars, PharmD, BCPS 02/02/2013@12 :25 PM

## 2013-02-02 NOTE — Progress Notes (Addendum)
[facility] for Heparin >> Coumadin Indication: VTE Treatment, Afib  Allergies  Allergen Reactions  . Codeine     REACTION: UNKNOWN REACTION  . Penicillins    Patient Measurements: Height: 5\' 8"  (172.7 cm) Weight: 158 lb 11.7 oz (72 kg) IBW/kg (Calculated) : 63.9  Vital Signs: Temp: 98.5 F (36.9 C) (01/28 0400) Temp src: Axillary (01/28 0000) BP: 151/45 mmHg (01/28 0600) Pulse Rate: 65 (01/27 2213)  Labs:  Recent Labs  01/31/13 1825 01/31/13 2314 02/01/13 0424 02/01/13 1500 02/02/13 0514  HGB 11.5*  --  10.1*  --  10.3*  HCT 35.1*  --  31.5*  --  31.8*  PLT 203  --  175  --  200  LABPROT  --   --   --   --  14.7  INR  --   --   --   --  1.17  HEPARINUNFRC  --   --   --  0.59 0.21*  CREATININE 1.08  --  1.17*  --  1.40*  TROPONINI  --  <0.30  --   --   --    Estimated Creatinine Clearance: 32.3 ml/min (by C-G formula based on Cr of 1.4).  Medical History: Past Medical History  Diagnosis Date  . ESRD (end stage renal disease)     etiology unknown, history of dialysis  . Shingles     October 2014  . Diabetes mellitus without complication    Medications:  Scheduled:  . ALPRAZolam  1 mg Oral QHS  . calcitRIOL  0.25 mcg Oral Q M,W,F  . calcium carbonate  1 tablet Oral BID WC  . ceFEPime (MAXIPIME) IV  1 g Intravenous Q12H  . digoxin  0.25 mg Intravenous Once  . digoxin  0.125 mg Oral Daily  . furosemide  40 mg Intravenous Q12H  . gabapentin  100 mg Oral QHS  . insulin aspart  0-9 Units Subcutaneous TID WC  . metoprolol  75 mg Oral BID  . sirolimus  1 mg Oral Daily  . sodium chloride  3 mL Intravenous Q12H  . tacrolimus  1.5 mg Oral BID  . vancomycin  1,000 mg Intravenous Q24H  . Warfarin - Pharmacist Dosing Inpatient   Does not apply Q24H   Assessment: 80 yo female admitted with HCAP.  Dopplers + RLE DVT and she has been in Afib since admission.  CHADS2VASc= 4.  Heparin & warfarin have been initiated- overlap day  #2/5.   INR is rising to goal.  Heparin level is now slightly below goal range.   No bleeding noted.    Goal of Therapy:  INR 2-3 Heparin level 0.3-0.7 units/ml Monitor platelets by anticoagulation protocol: Yes   Plan:  Increase heparin infusion to 1250 units/hr Recheck heparin level in 8 hrs  Coumadin 3mg  po today x 1 at 4pm INR daily Overlap with Coumadin at least 5 days and until INR > 2 x 24h Monitor daily heparin level & CBC  Candita Borenstein, Lavonia Drafts 02/02/2013,7:40 AM  Due to patient's limited IV access and incompatible medications, heparin infusion is being changed to sq Lovenox.   Her renal function is borderline for dose adjustment.  Given that her Scr has worsened over past several days will round down- 1.  Lovenox 75mg  sq q24h 2.  D/C heparin once Lovenox injection given 3.  F/U CBC & renal function  Netta Cedars, PharmD, BCPS 02/02/2013@12 :39 PM

## 2013-02-03 ENCOUNTER — Inpatient Hospital Stay (HOSPITAL_COMMUNITY): Payer: Medicare PPO

## 2013-02-03 LAB — GLUCOSE, CAPILLARY
GLUCOSE-CAPILLARY: 254 mg/dL — AB (ref 70–99)
GLUCOSE-CAPILLARY: 111 mg/dL — AB (ref 70–99)
Glucose-Capillary: 163 mg/dL — ABNORMAL HIGH (ref 70–99)

## 2013-02-03 LAB — PROTIME-INR
INR: 1.08 (ref 0.00–1.49)
Prothrombin Time: 13.8 seconds (ref 11.6–15.2)

## 2013-02-03 LAB — LEGIONELLA ANTIGEN, URINE: Legionella Antigen, Urine: NEGATIVE

## 2013-02-03 MED ORDER — IPRATROPIUM BROMIDE 0.02 % IN SOLN
0.5000 mg | Freq: Three times a day (TID) | RESPIRATORY_TRACT | Status: DC
  Administered 2013-02-03 – 2013-02-07 (×10): 0.5 mg via RESPIRATORY_TRACT
  Filled 2013-02-03 (×13): qty 2.5

## 2013-02-03 MED ORDER — WARFARIN SODIUM 5 MG PO TABS
5.0000 mg | ORAL_TABLET | Freq: Once | ORAL | Status: AC
  Filled 2013-02-03: qty 1

## 2013-02-03 MED ORDER — METOPROLOL TARTRATE 50 MG PO TABS: 100.0000 mg | ORAL_TABLET | Freq: Two times a day (BID) | ORAL | Status: DC

## 2013-02-03 MED ORDER — LORAZEPAM 2 MG/ML IJ SOLN: 0.5000 mg | Freq: Once | INTRAMUSCULAR | Status: DC

## 2013-02-03 MED ORDER — MORPHINE SULFATE 2 MG/ML IJ SOLN
2.0000 mg | INTRAMUSCULAR | Status: DC | PRN
  Administered 2013-02-03 – 2013-02-07 (×10): 2 mg via INTRAVENOUS
  Filled 2013-02-03 (×9): qty 1

## 2013-02-03 MED ORDER — LEVALBUTEROL HCL 0.63 MG/3ML IN NEBU: 0.6300 mg | INHALATION_SOLUTION | Freq: Four times a day (QID) | RESPIRATORY_TRACT | Status: DC

## 2013-02-03 MED ORDER — LORAZEPAM 2 MG/ML IJ SOLN
INTRAMUSCULAR | Status: AC
  Administered 2013-02-03: 0.5 mg
  Filled 2013-02-03: qty 1

## 2013-02-03 MED ORDER — LEVALBUTEROL HCL 0.63 MG/3ML IN NEBU
0.6300 mg | INHALATION_SOLUTION | Freq: Four times a day (QID) | RESPIRATORY_TRACT | Status: DC | PRN
  Administered 2013-02-03 – 2013-02-04 (×3): 0.63 mg via RESPIRATORY_TRACT
  Filled 2013-02-03 (×2): qty 3

## 2013-02-03 MED ORDER — FUROSEMIDE 10 MG/ML IJ SOLN
40.0000 mg | Freq: Every day | INTRAMUSCULAR | Status: DC
  Administered 2013-02-04: 40 mg via INTRAVENOUS
  Filled 2013-02-03: qty 4

## 2013-02-03 MED ORDER — LEVALBUTEROL HCL 0.63 MG/3ML IN NEBU: 0.6300 mg | INHALATION_SOLUTION | Freq: Three times a day (TID) | RESPIRATORY_TRACT | Status: DC

## 2013-02-03 MED ORDER — LORAZEPAM 0.5 MG PO TABS
0.5000 mg | ORAL_TABLET | Freq: Three times a day (TID) | ORAL | Status: DC | PRN
  Filled 2013-02-03: qty 1

## 2013-02-03 MED ORDER — LEVALBUTEROL HCL 0.63 MG/3ML IN NEBU
0.6300 mg | INHALATION_SOLUTION | Freq: Three times a day (TID) | RESPIRATORY_TRACT | Status: DC
  Administered 2013-02-03 – 2013-02-07 (×10): 0.63 mg via RESPIRATORY_TRACT
  Filled 2013-02-03 (×13): qty 3

## 2013-02-03 MED ORDER — FUROSEMIDE 10 MG/ML IJ SOLN
40.0000 mg | Freq: Once | INTRAMUSCULAR | Status: AC
  Administered 2013-02-03: 40 mg via INTRAVENOUS
  Filled 2013-02-03: qty 4

## 2013-02-03 MED ORDER — INSULIN ASPART 100 UNIT/ML ~~LOC~~ SOLN
3.0000 [IU] | Freq: Three times a day (TID) | SUBCUTANEOUS | Status: DC
Start: 2013-02-03 — End: 2013-02-07
  Administered 2013-02-03 – 2013-02-06 (×4): 3 [IU] via SUBCUTANEOUS

## 2013-02-03 MED ORDER — IPRATROPIUM BROMIDE 0.02 % IN SOLN: 0.5000 mg | Freq: Four times a day (QID) | RESPIRATORY_TRACT | Status: DC

## 2013-02-03 NOTE — Progress Notes (Signed)
[facility] for Lovenox >> Coumadin Indication: VTE Treatment, Afib  Allergies  Allergen Reactions  . Codeine     REACTION: UNKNOWN REACTION  . Penicillins    Patient Measurements: Height: 5\' 8"  (172.7 cm) Weight: 158 lb 11.7 oz (72 kg) IBW/kg (Calculated) : 63.9  Vital Signs: Temp: 97.6 F (36.4 C) (01/29 0636) Temp src: Oral (01/29 0636) BP: 133/71 mmHg (01/29 0636) Pulse Rate: 70 (01/29 0636)  Labs:  Recent Labs  01/31/13 1825 01/31/13 2314 02/01/13 0424 02/01/13 1500 02/02/13 0514 02/03/13 0502  HGB 11.5*  --  10.1*  --  10.3*  --   HCT 35.1*  --  31.5*  --  31.8*  --   PLT 203  --  175  --  200  --   LABPROT  --   --   --   --  14.7 13.8  INR  --   --   --   --  1.17 1.08  HEPARINUNFRC  --   --   --  0.59 0.21*  --   CREATININE 1.08  --  1.17*  --  1.40*  --   TROPONINI  --  <0.30  --   --   --   --    Estimated Creatinine Clearance: 32.3 ml/min (by C-G formula based on Cr of 1.4).  Medical History: Past Medical History  Diagnosis Date  . ESRD (end stage renal disease)     etiology unknown, history of dialysis  . Shingles     October 2014  . Diabetes mellitus without complication    Medications:  Scheduled:  . ALPRAZolam  1 mg Oral QHS  . calcitRIOL  0.25 mcg Oral Q M,W,F  . calcium carbonate  1 tablet Oral BID WC  . ceFEPime (MAXIPIME) IV  1 g Intravenous Q24H  . digoxin  0.125 mg Oral Daily  . enoxaparin (LOVENOX) injection  75 mg Subcutaneous Q24H  . gabapentin  100 mg Oral QHS  . insulin aspart  0-15 Units Subcutaneous TID WC  . insulin aspart  0-5 Units Subcutaneous QHS  . levalbuterol  0.63 mg Nebulization Q6H   And  . ipratropium  0.5 mg Nebulization Q6H  . metoprolol tartrate  75 mg Oral BID  . sirolimus  1 mg Oral Daily  . sodium chloride  3 mL Intravenous Q12H  . tacrolimus  1.5 mg Oral BID  . vancomycin  1,000 mg Intravenous Q24H  . warfarin  5 mg Oral ONCE-1800  . Warfarin - Pharmacist Dosing  Inpatient   Does not apply Q24H   Assessment: 80 yo female admitted with HCAP.  Dopplers + RLE DVT and she has been in Afib since admission.  CHADS2VASc= 4.  Lovenox --> warfarin overlap day #3/5.   INR at baseline.  Renal function borderline for Lovenox dose adjustment. No bleeding noted.    Goal of Therapy:  INR 2-3 Monitor platelets by anticoagulation protocol: Yes   Plan:  Continue Lovenox 75mg  sq daily.  F/U Bmet in am.   Coumadin 5mg  po today x 1 at 4pm INR daily Overlap with Coumadin at least 5 days and until INR > 2 x 24h Monitor CBC  Biagio Borg 02/03/2013,7:44 AM

## 2013-02-03 NOTE — Progress Notes (Addendum)
Patient ID: [name]  female  VQM:[id number]    DOB: 06-Nov-1930    DOA: 01/31/2013  PCP: [name]., MD  Addendum:  Called by respiratory therapist, patient having trouble breathing, anxious. Evaluated patient,  Chest: Tight with expiratory wheezing  Obtain a stat portable chest x-ray Continue with nebulizer breathing treatment, Ativan 0.5mg  IV x1, Lasix 40 mg IV x1, BiPAP  CT chest on 01/31/2013 had shown no pulmonary embolism but patient had moderate to large bilateral pleural effusions airspace consolidations.   Jaycie Kregel M.D. [facility] 02/03/2013, 3:48 PM  Pager: [phone]    Assessment/Plan: Principal Problem: 1. Acute respiratory failure secondary to acute on chronic diastolic heart failure/ Health care associated pneumonia: improving, O2 sats 94% on 2 L - continue IV vancomycin and cefepime, on scheduled xopenex and atrovent - Urine strep antigen negative, ur legionella pending.   2. New Onset Afib with RVR: Rate controlled - Patient was started on Cardizem drip, currently off.  - Currently on oral Cardizem, increased metoprolol, on digoxin - Continue therapeutic Lovenox and Coumadin per pharmacy - 2d ECHO on 1/15 head showed EF of 60-65% with grade 2 diastolic dysfunction  3.LLE DVT:  -Started on Lovenox yesterday, RN to provide teaching  - coumadin as per pharmacy.   4. Mild acute renal insufficiency;  - probably from diuresis, BMET in am.   5. H/o Renal Transplant:  - resume immunosuppressants.   6. Klebsiella pneumonia UTI:  - Urine culture reviewed, continue cefepime  7. Diabetes Mellitus:  - Hemoglobin A1c 7.1, continue moderate sliding scale insulin, meal coverage  8. Neutropenia; improving  DVT Prophylaxis: Lovenox and Coumadin  Code Status: DO NOT RESUSCITATE  Family Communication: Discussed with multiple family members, husband, grandson and church friend  Disposition:  DC in 1 or 2 days when stable. Patient wants to return  home, will arrange home health physical therapy, RN.     Subjective: Feels better, denies any specific complaints heart rate controlled no acute issues overnight  Objective: Weight change:   Intake/Output Summary (Last 24 hours) at 02/03/13 1137 Last data filed at 02/03/13 1016  Gross per 24 hour  Intake  409.2 ml  Output    425 ml  Net  -15.8 ml   Blood pressure 133/71, pulse 71, temperature 97.6 F (36.4 C), temperature source Oral, resp. rate 20, height 5\' 8"  (1.727 m), weight 72 kg (158 lb 11.7 oz), SpO2 94.00%.  Physical Exam: General: Alert and awake, oriented, NAD CVS: S1-S2 clear, normal sinus rhythm Chest: Bilateral rhonchi improving Abdomen: soft nontender, nondistended, normal bowel sounds  Extremities: no cyanosis, clubbing. 1+ edema L>R   Lab Results: Basic Metabolic Panel:  Recent Labs Lab 02/01/13 0424 02/02/13 0514  NA 136* 136*  K 4.3 4.1  CL 95* 98  CO2 24 25  GLUCOSE 407* 229*  BUN 29* 43*  CREATININE 1.17* 1.40*  CALCIUM 9.0 9.2   Liver Function Tests:  Recent Labs Lab 01/31/13 1825 02/01/13 0424  AST 17 12  ALT 10 8  ALKPHOS 84 70  BILITOT 0.6 0.4  PROT 7.7 6.3  ALBUMIN 3.3* 2.6*   No results found for this basename: LIPASE, AMYLASE,  in the last 168 hours No results found for this basename: AMMONIA,  in the last 168 hours CBC:  Recent Labs Lab 02/01/13 0424 02/02/13 0514  WBC 1.3* 2.7*  NEUTROABS 1.1*  --   HGB 10.1* 10.3*  HCT 31.5* 31.8*  MCV 83.6 83.7  PLT 175 200  Cardiac Enzymes:  Recent Labs Lab 01/31/13 2314  TROPONINI <0.30   BNP: No components found with this basename: POCBNP,  CBG:  Recent Labs Lab 02/02/13 1152 02/02/13 1622 02/02/13 2045 02/03/13 0732 02/03/13 1117  GLUCAP 202* 140* 151* 254* 163*     Micro Results: Recent Results (from the past 240 hour(s))  URINE CULTURE     Status: None   Collection Time    01/31/13  7:30 PM      Result Value Range Status   Specimen Description  URINE, CATHETERIZED   Final   Special Requests NONE   Final   Culture  Setup Time     Final   Value: 01/31/2013 22:30     Performed at [facility]     Final   Value: >=100,000 COLONIES/ML     Performed at [facility]   Culture     Final   Value: KLEBSIELLA PNEUMONIAE     Performed at [facility]   Report Status 02/02/2013 FINAL   Final   Organism ID, Bacteria KLEBSIELLA PNEUMONIAE   Final    Studies/Results: Ct Abdomen Pelvis Wo Contrast  01/15/2013   CLINICAL DATA:  Fall.  Altered mental status.  EXAM: CT ABDOMEN AND PELVIS WITHOUT CONTRAST  TECHNIQUE: Multidetector CT imaging of the abdomen and pelvis was performed following the standard protocol without intravenous contrast.  COMPARISON:  None.  FINDINGS: The patient has large bilateral pleural effusions. There is cardiomegaly. No pericardial effusion. Compressive atelectasis in lung bases is noted.  The right kidney is markedly atrophic with multiple cysts identified. There is a cystic structure in the left upper quadrant with dense rim calcification which may represent the patient's left kidney or adrenal gland. The left adrenal gland is not definitely visualized. There is a right lower quadrant renal transplant with 2-3 small lobules of gas the renal collecting system likely related to Foley catheterization. The patient is status post cholecystectomy. The liver, spleen and pancreas are unremarkable. Extensive atherosclerotic vascular disease is seen. The patient is status post hysterectomy. There is a trace amount of free pelvic fluid. The stomach and small and large bowel appear normal. Diffuse body wall edema is noted. The patient is status post hysterectomy. No lymphadenopathy is seen. No fracture is seen. No lytic or sclerotic bony lesion is seen with multilevel lumbar spondylosis noted.  IMPRESSION: Large appearing bilateral pleural effusions with associated compressive basilar atelectasis.   Cardiomegaly.  Trace amount of free pelvic fluid is nonspecific but could be due to IV fluid resuscitation or anasarca with diffuse body wall edema noted.  Tiny amount of air in the right lower quadrant renal transplant is likely related to Foley catheterization.   Electronically Signed   By: Inge Rise M.D.   On: 01/15/2013 05:52   Dg Chest 1 View  01/31/2013   CLINICAL DATA:  Shortness of breath, CHF  EXAM: CHEST - 1 VIEW  COMPARISON:  01/17/2013  FINDINGS: Rotated to the left.  Enlargement of cardiac silhouette.  Atherosclerotic calcification aortic arch.  Slight pulmonary vascular congestion.  Interstitial infiltrates likely pulmonary edema, is improved from previous exam.  Increased bibasilar atelectasis and again noted small bibasilar effusions.  No pneumothorax.  Bones demineralized with orthopedic hardware at the proximal left humerus.  IMPRESSION: Minimal CHF improved from previous exam.  Bibasilar atelectasis and small pleural effusions.   Electronically Signed   By: Lavonia Dana M.D.   On: 01/31/2013 17:21   Ct  Head Wo Contrast  01/15/2013   CLINICAL DATA:  Altered mental status.  Status post fall.  EXAM: CT HEAD WITHOUT CONTRAST  CT CERVICAL SPINE WITHOUT CONTRAST  TECHNIQUE: Multidetector CT imaging of the head and cervical spine was performed following the standard protocol without intravenous contrast. Multiplanar CT image reconstructions of the cervical spine were also generated.  COMPARISON:  Cervical spine CT scan 02/28/2003. Brain MRI 07/23/2005.  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 Chronic microvascular ischemic change is seen. No evidence of acute abnormality including infarction, hemorrhage, mass lesion, mass effect, midline shift or abnormal extra-axial fluid collection. Mucosal thickening bilateral maxillary sinuses and is seen. There is some scattered ethmoid air cell disease. Prosthetic left globe is noted. The calvarium is intact.   CT CERVICAL SPINE FINDINGS  No fracture is identified. Mild anterolisthesis C4 on C7 and C7 on T1 due to facet arthropathy is noted. Anterior endplate spurring is seen. The patient has large appearing bilateral pleural effusions. Interlobular septal thickening in the apices is noted.  IMPRESSION: No acute finding head or cervical spine.  Chronic microvascular ischemic change and remote right parieto-occipital infarct.  Large appearing bilateral pleural effusions and interlobular septal thickening compatible with pulmonary edema.   Electronically Signed   By: Inge Rise M.D.   On: 01/15/2013 05:46   Ct Angio Chest Pe W/cm &/or Wo Cm  01/31/2013   CLINICAL DATA:  History of renal transplant, shortness of breath.  EXAM: CT ANGIOGRAPHY CHEST WITH CONTRAST  TECHNIQUE: Multidetector CT imaging of the chest was performed using the standard protocol during bolus administration of intravenous contrast. Multiplanar CT image reconstructions including MIPs were obtained to evaluate the vascular anatomy.  CONTRAST:  87mL OMNIPAQUE IOHEXOL 350 MG/ML SOLN  COMPARISON:  01/31/2013 radiograph, 01/15/2013 abdomen CT  FINDINGS: The pulmonary arterial vessels are patent.  Mild ectasia of the ascending aorta up to 3.6 cm, tapering along the articular normal caliber. There is scattered atherosclerotic disease of the aorta and branch vessels.  Mild cardiac enlargement. Coronary artery and aortic valvular calcifications. Trace pericardial fluid and/or thickening.  Moderate to large bilateral pleural effusions. Associated airspace consolidations.  Reactive sized intrathoracic lymph nodes. Heterogeneous thyroid gland with multiple small nodules.  Upper abdominal images show a 3.1 cm peripherally calcified ovoid lesion in the region of the left adrenal gland, indeterminate however unchanged from the recent prior.  Interlobular septal thickening and hazy airspace opacities. Linear opacities within the middle lobe and lingula, favored  to reflect atelectasis.  Multilevel degenerative changes, flowing anterior osteophytes. No acute osseous finding.  Review of the MIP images confirms the above findings.  IMPRESSION: No pulmonary embolism.  Pulmonary edema/ CHF pattern.  Moderate to large bilateral pleural effusions with associated airspace consolidations; atelectasis versus pneumonia.   Electronically Signed   By: Carlos Levering M.D.   On: 01/31/2013 22:55   Ct Cervical Spine Wo Contrast  01/15/2013   CLINICAL DATA:  Altered mental status.  Status post fall.  EXAM: CT HEAD WITHOUT CONTRAST  CT CERVICAL SPINE WITHOUT CONTRAST  TECHNIQUE: Multidetector CT imaging of the head and cervical spine was performed following the standard protocol without intravenous contrast. Multiplanar CT image reconstructions of the cervical spine were also generated.  COMPARISON:  Cervical spine CT scan 02/28/2003. Brain MRI 07/23/2005.  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 Chronic microvascular ischemic change is seen. No evidence of acute abnormality including infarction, hemorrhage, mass lesion, mass effect, midline shift  or abnormal extra-axial fluid collection. Mucosal thickening bilateral maxillary sinuses and is seen. There is some scattered ethmoid air cell disease. Prosthetic left globe is noted. The calvarium is intact.  CT CERVICAL SPINE FINDINGS  No fracture is identified. Mild anterolisthesis C4 on C7 and C7 on T1 due to facet arthropathy is noted. Anterior endplate spurring is seen. The patient has large appearing bilateral pleural effusions. Interlobular septal thickening in the apices is noted.  IMPRESSION: No acute finding head or cervical spine.  Chronic microvascular ischemic change and remote right parieto-occipital infarct.  Large appearing bilateral pleural effusions and interlobular septal thickening compatible with pulmonary edema.   Electronically Signed   By: Inge Rise M.D.   On: 01/15/2013  05:46   US Venous Img Lower Bilateral  01/15/2013   CLINICAL DATA:  Bilateral lower extremity edema.  EXAM: BILATERAL LOWER EXTREMITY VENOUS DOPPLER ULTRASOUND  TECHNIQUE: Gray-scale sonography with graded compression, as well as color Doppler and duplex ultrasound, were performed to evaluate the deep venous system from the level of the common femoral vein through the popliteal and proximal calf veins. Spectral Doppler was utilized to evaluate flow at rest and with distal augmentation maneuvers.  COMPARISON:  None.  FINDINGS: Thrombus within deep veins: No DVT is identified in the right lower extremity. The left femoral vein is partially compressible in the mid thigh and over a segment demonstrates evidence of nonocclusive mural thrombus. Part of this thrombus appears echogenic and this may represent chronic thrombus. No occlusive thrombus is identified in the left lower extremity.  Duplex waveform respiratory phasicity:  Normal.  Duplex waveform response to augmentation:  Normal.  Venous reflux:  None visualized.  Other findings: No evidence of superficial thrombophlebitis or abnormal fluid collection.  IMPRESSION: Nonocclusive mural thrombus in the femoral vein of the left thigh. Based on appearance, this may be chronic thrombus.   Electronically Signed   By: Aletta Edouard M.D.   On: 01/15/2013 11:21   US Venous Img Lower Unilateral Right  01/31/2013   CLINICAL DATA:  Right lower extremity pain.  EXAM: RIGHT LOWER EXTREMITY VENOUS DOPPLER ULTRASOUND  TECHNIQUE: Gray-scale sonography with graded compression, as well as color Doppler and duplex ultrasound, were performed to evaluate the deep venous system from the level of the common femoral vein through the popliteal and proximal calf veins. Spectral Doppler was utilized to evaluate flow at rest and with distal augmentation maneuvers.  COMPARISON:  US VENOUS IMG LOWER  BILATERAL dated 01/15/2013  FINDINGS: Thrombus within deep veins: Nonocclusive thrombus is  again identified in the femoral vein at the level of the mid to distal thigh. There does appear to be some progression since the prior ultrasound study with some nonocclusive thrombus now also evident in the popliteal vein, which is new.  Duplex waveform respiratory phasicity:  Normal.  Duplex waveform response to augmentation:  Normal.  Venous reflux:  None visualized.  Other findings: No evidence of superficial thrombophlebitis or abnormal fluid collection.  IMPRESSION: Nonocclusive thrombus in the femoral and popliteal vein of the right lower extremity. This appears progressive since the prior study on 01/15/2013 and there likely is a component of acute DVT which may have propagated from a segment of more chronic appearing thrombus previously.   Electronically Signed   By: Aletta Edouard M.D.   On: 01/31/2013 15:59   Dg Chest Port 1 View  01/17/2013   CLINICAL DATA:  CHF and bilateral pleural effusions.  EXAM: PORTABLE CHEST - 1 VIEW  COMPARISON:  01/16/2013  FINDINGS: Less prominent pleural effusion and lower lobe atelectasis on the left with residual atelectasis/ consolidation remaining as well as some remaining left pleural fluid. The right pleural effusion is relatively stable. Perihilar atelectasis on the right has improved. Interstitial edema is relatively stable.  IMPRESSION: Less prominent left pleural effusion and left lower lobe atelectasis. Decrease in right perihilar atelectasis.   Electronically Signed   By: Aletta Edouard M.D.   On: 01/17/2013 08:00   Dg Chest Port 1 View  01/16/2013   CLINICAL DATA:  CHF  EXAM: PORTABLE CHEST - 1 VIEW  COMPARISON:  01/15/2013  FINDINGS: Heart remains enlarged with diffuse edema pattern throughout the lungs. Enlarging pleural effusions present. Increased right hilar and bibasilar atelectasis/ collapse. No pneumothorax. Previous left proximal humerus ORIF for a remote fracture.  IMPRESSION: Persistent CHF pattern.  Enlarging pleural effusions with worsening  bibasilar compressive atelectasis/collapse.  Increased right perihilar atelectasis as well.   Electronically Signed   By: Daryll Brod M.D.   On: 01/16/2013 07:48   Dg Chest Port 1 View  01/15/2013   CLINICAL DATA:  Altered mental status.  Fall, weakness.  EXAM: PORTABLE CHEST - 1 VIEW  COMPARISON:  Plain film of the chest 05/06/2007.  FINDINGS: There is extensive bilateral airspace disease. Pleural effusions are identified and appear greater on the right. Cardiomegaly is seen.  IMPRESSION: Appears the chest most compatible with congestive heart failure with associated pleural effusions and basilar atelectasis.   Electronically Signed   By: Inge Rise M.D.   On: 01/15/2013 05:05    Medications: Scheduled Meds: . ALPRAZolam  1 mg Oral QHS  . calcitRIOL  0.25 mcg Oral Q M,W,F  . calcium carbonate  1 tablet Oral BID WC  . ceFEPime (MAXIPIME) IV  1 g Intravenous Q24H  . digoxin  0.125 mg Oral Daily  . enoxaparin (LOVENOX) injection  75 mg Subcutaneous Q24H  . gabapentin  100 mg Oral QHS  . insulin aspart  0-15 Units Subcutaneous TID WC  . insulin aspart  0-5 Units Subcutaneous QHS  . ipratropium  0.5 mg Nebulization TID   And  . levalbuterol  0.63 mg Nebulization TID  . metoprolol tartrate  100 mg Oral BID  . sirolimus  1 mg Oral Daily  . sodium chloride  3 mL Intravenous Q12H  . tacrolimus  1.5 mg Oral BID  . vancomycin  1,000 mg Intravenous Q24H  . warfarin  5 mg Oral ONCE-1800  . Warfarin - Pharmacist Dosing Inpatient   Does not apply Q24H      LOS: 3 days   Devony Mcgrady M.D. [facility] 02/03/2013, 11:37 AM Pager: [phone]  If 7PM-7AM, please contact night-coverage [email or web address] Password [email or web address]

## 2013-02-03 NOTE — Progress Notes (Signed)
Patient ID: [name], female   DOB: [birth year], 80 y.o.   MRN: [id number]    Primary cardiologist:  Subjective:      Objective:   Temp:  [97.6 F (36.4 C)-98.6 F (37 C)] 97.6 F (36.4 C) (01/29 0636) Pulse Rate:  [70-120] 70 (01/29 0636) Resp:  [20-29] 28 (01/29 0636) BP: (133-172)/(48-74) 133/71 mmHg (01/29 0636) SpO2:  [76 %-97 %] 90 % (01/29 0636) Last BM Date: 02/02/13  Filed Weights   02/01/13 0300 02/01/13 0500  Weight: 155 lb 3.3 oz (70.4 kg) 158 lb 11.7 oz (72 kg)    Intake/Output Summary (Last 24 hours) at 02/03/13 1004 Last data filed at 02/03/13 0830  Gross per 24 hour  Intake  409.2 ml  Output    325 ml  Net   84.2 ml    Telemetry:  Exam:  General:  HEENT:  Resp:  Cardiac:  GI:  MSK:  Neuro:   Psych  Lab Results:  Basic Metabolic Panel:  Recent Labs Lab 01/31/13 1825 02/01/13 0424 02/02/13 0514  NA 137 136* 136*  K 4.1 4.3 4.1  CL 95* 95* 98  CO2 28 24 25   GLUCOSE 236* 407* 229*  BUN 25* 29* 43*  CREATININE 1.08 1.17* 1.40*  CALCIUM 10.1 9.0 9.2    Liver Function Tests:  Recent Labs Lab 01/31/13 1825 02/01/13 0424  AST 17 12  ALT 10 8  ALKPHOS 84 70  BILITOT 0.6 0.4  PROT 7.7 6.3  ALBUMIN 3.3* 2.6*    CBC:  Recent Labs Lab 01/31/13 1825 02/01/13 0424 02/02/13 0514  WBC 3.7* 1.3* 2.7*  HGB 11.5* 10.1* 10.3*  HCT 35.1* 31.5* 31.8*  MCV 83.6 83.6 83.7  PLT 203 175 200    Cardiac Enzymes:  Recent Labs Lab 01/31/13 2314  TROPONINI <0.30    BNP:  Recent Labs  01/15/13 0516 01/31/13 2314  PROBNP 13226.0* 12081.0*    Coagulation:  Recent Labs Lab 02/02/13 0514 02/03/13 0502  INR 1.17 1.08    ECG:   Medications:   Scheduled Medications: . ALPRAZolam  1 mg Oral QHS  . calcitRIOL  0.25 mcg Oral Q M,W,F  . calcium carbonate  1 tablet Oral BID WC  . ceFEPime (MAXIPIME) IV  1 g Intravenous Q24H  . digoxin  0.125 mg Oral Daily  . enoxaparin (LOVENOX) injection  75 mg Subcutaneous  Q24H  . gabapentin  100 mg Oral QHS  . insulin aspart  0-15 Units Subcutaneous TID WC  . insulin aspart  0-5 Units Subcutaneous QHS  . levalbuterol  0.63 mg Nebulization Q6H   And  . ipratropium  0.5 mg Nebulization Q6H  . metoprolol tartrate  75 mg Oral BID  . sirolimus  1 mg Oral Daily  . sodium chloride  3 mL Intravenous Q12H  . tacrolimus  1.5 mg Oral BID  . vancomycin  1,000 mg Intravenous Q24H  . warfarin  5 mg Oral ONCE-1800  . Warfarin - Pharmacist Dosing Inpatient   Does not apply Q24H     Infusions:     PRN Medications:  sodium chloride, guaiFENesin, ondansetron (ZOFRAN) IV, ondansetron, sodium chloride     Assessment/Plan    1. New onset atrial fib: May be related to acute illness with UTI. She was placed on cardizem gtt with some BP softness, loaded with IV digoxin yesterday. Now in sinus rhythm with normal rates  - rates well controlled this morning on oral dilt and metop. Did not some high rates  overnight, will increase lopressor to 100 mg bid.  - she is on heparin for both DVT and afib, transitioning to coumadin. Her CHADS2Vasc score is 4, anticoag is indicated.   2. Acute/Chronic Diastolic CHF:  - lasix discontinued after Cr trended up, appears euvolemic  3. DVT:  - on heparin gtt, recommend transitioning to oral coumadin.      Cardiology will sign off of inpatient care. She may follow up with NP Purcell Nails in 2-3 weeks. Please call back if needed.     Carlyle Dolly, M.D., F.A.C.C.

## 2013-02-03 NOTE — Clinical Social Work Note (Signed)
Chart reviewed, CSW signing off at this time as plan appears to be for patient to return home at discharge as this is patient's clear wish.  CSW will stand by and follow peripherally, can be reconsulted to assist if placement is needed.  Family has already informed [facility] that patient will not be returning and have not paid bed hold.  Edwyna Shell, LCSW Clinical Social Worker [phone])

## 2013-02-03 NOTE — Progress Notes (Signed)
UR chart review completed.  

## 2013-02-03 NOTE — Progress Notes (Signed)
Called by RN as family did not want patient to move to ICU or step down. They want her to be comfortable.   Patient was placed on Bipap this afternoon for respiratory distress. She has Afib and diastolic CHF. CXR showed pulmonary edema.   Patient unresponsive. Lungs sound coarse with few crackles.  Family aware of poor prognosis.  Will leave on floor and continue bipap mainly for comfort. Morphine PRN for pain, dyspnea.  Will not be too aggressive at this time in keeping with family's wishes.  Discussed with patient's husband and daughter.  Nasim Habeeb 9:54 PM

## 2013-02-03 NOTE — Discharge Instructions (Signed)
Information on my medicine - Coumadin   (Warfarin)  This medication education was reviewed with me or my healthcare representative as part of my discharge preparation.  The pharmacist that spoke with me during my hospital stay was:  Biagio Borg, [facility]  Why was Coumadin prescribed for you? Coumadin was prescribed for you because you have a blood clot or a medical condition that can cause an increased risk of forming blood clots. Blood clots can cause serious health problems by blocking the flow of blood to the heart, lung, or brain. Coumadin can prevent harmful blood clots from forming. As a reminder your indication for Coumadin is:   Deep Vein Thrombosis Treatment  What test will check on my response to Coumadin? While on Coumadin (warfarin) you will need to have an INR test regularly to ensure that your dose is keeping you in the desired range. The INR (international normalized ratio) number is calculated from the result of the laboratory test called prothrombin time (PT).  If an INR APPOINTMENT HAS NOT ALREADY BEEN MADE FOR YOU please schedule an appointment to have this lab work done by your health care provider within 7 days. Your INR goal is usually a number between:  2 to 3 or your provider may give you a more narrow range like 2-2.5.  Ask your health care provider during an office visit what your goal INR is.  What  do you need to  know  About  COUMADIN? Take Coumadin (warfarin) exactly as prescribed by your healthcare provider about the same time each day.  DO NOT stop taking without talking to the doctor who prescribed the medication.  Stopping without other blood clot prevention medication to take the place of Coumadin may increase your risk of developing a new clot or stroke.  Get refills before you run out.  What do you do if you miss a dose? If you miss a dose, take it as soon as you remember on the same day then continue your regularly scheduled regimen the next day.  Do  not take two doses of Coumadin at the same time.  Important Safety Information A possible side effect of Coumadin (Warfarin) is an increased risk of bleeding. You should call your healthcare provider right away if you experience any of the following:   Bleeding from an injury or your nose that does not stop.   Unusual colored urine (red or dark brown) or unusual colored stools (red or black).   Unusual bruising for unknown reasons.   A serious fall or if you hit your head (even if there is no bleeding).  Some foods or medicines interact with Coumadin (warfarin) and might alter your response to warfarin. To help avoid this:   Eat a balanced diet, maintaining a consistent amount of Vitamin K.   Notify your provider about major diet changes you plan to make.   Avoid alcohol or limit your intake to 1 drink for women and 2 drinks for men per day. (1 drink is 5 oz. wine, 12 oz. beer, or 1.5 oz. liquor.)  Make sure that ANY health care provider who prescribes medication for you knows that you are taking Coumadin (warfarin).  Also make sure the healthcare provider who is monitoring your Coumadin knows when you have started a new medication including herbals and non-prescription products.  Coumadin (Warfarin)  Major Drug Interactions  Increased Warfarin Effect Decreased Warfarin Effect  Alcohol (large quantities) Antibiotics (esp. Septra/Bactrim, Flagyl, Cipro) Amiodarone (Cordarone) Aspirin (ASA) Cimetidine (  Tagamet) Megestrol (Megace) NSAIDs (ibuprofen, naproxen, etc.) Piroxicam (Feldene) Propafenone (Rythmol SR) Propranolol (Inderal) Isoniazid (INH) Posaconazole (Noxafil) Barbiturates (Phenobarbital) Carbamazepine (Tegretol) Chlordiazepoxide (Librium) Cholestyramine (Questran) Griseofulvin Oral Contraceptives Rifampin Sucralfate (Carafate) Vitamin K   Coumadin (Warfarin) Major Herbal Interactions  Increased Warfarin Effect Decreased Warfarin Effect  Garlic Ginseng Ginkgo  biloba Coenzyme Q10 Green tea St. Johns wort    Coumadin (Warfarin) FOOD Interactions  Eat a consistent number of servings per week of foods HIGH in Vitamin K (1 serving =  cup)  Collards (cooked, or boiled & drained) Kale (cooked, or boiled & drained) Mustard greens (cooked, or boiled & drained) Parsley *serving size only =  cup Spinach (cooked, or boiled & drained) Swiss chard (cooked, or boiled & drained) Turnip greens (cooked, or boiled & drained)  Eat a consistent number of servings per week of foods MEDIUM-HIGH in Vitamin K (1 serving = 1 cup)  Asparagus (cooked, or boiled & drained) Broccoli (cooked, boiled & drained, or raw & chopped) Brussel sprouts (cooked, or boiled & drained) *serving size only =  cup Lettuce, raw (green leaf, endive, romaine) Spinach, raw Turnip greens, raw & chopped   These websites have more information on Coumadin (warfarin):  FailFactory.se; VeganReport.com.au;

## 2013-02-04 LAB — GLUCOSE, CAPILLARY
GLUCOSE-CAPILLARY: 115 mg/dL — AB (ref 70–99)
GLUCOSE-CAPILLARY: 149 mg/dL — AB (ref 70–99)
Glucose-Capillary: 196 mg/dL — ABNORMAL HIGH (ref 70–99)
Glucose-Capillary: 165 mg/dL — ABNORMAL HIGH (ref 70–99)

## 2013-02-04 LAB — CBC
HCT: 34.7 % — ABNORMAL LOW (ref 36.0–46.0)
Hemoglobin: 11 g/dL — ABNORMAL LOW (ref 12.0–15.0)
MCH: 26.8 pg (ref 26.0–34.0)
MCHC: 31.7 g/dL (ref 30.0–36.0)
MCV: 84.6 fL (ref 78.0–100.0)
PLATELETS: 163 10*3/uL (ref 150–400)
RBC: 4.1 MIL/uL (ref 3.87–5.11)
RDW: 15 % (ref 11.5–15.5)
WBC: 3.1 10*3/uL — ABNORMAL LOW (ref 4.0–10.5)

## 2013-02-04 LAB — PRO B NATRIURETIC PEPTIDE: Pro B Natriuretic peptide (BNP): 27216 pg/mL — ABNORMAL HIGH (ref 0–450)

## 2013-02-04 LAB — BASIC METABOLIC PANEL
BUN: 44 mg/dL — ABNORMAL HIGH (ref 6–23)
CHLORIDE: 102 meq/L (ref 96–112)
CO2: 27 meq/L (ref 19–32)
Calcium: 9.7 mg/dL (ref 8.4–10.5)
Creatinine, Ser: 1.32 mg/dL — ABNORMAL HIGH (ref 0.50–1.10)
GFR calc non Af Amer: 37 mL/min — ABNORMAL LOW (ref >90)
GFR, EST AFRICAN AMERICAN: 43 mL/min — AB (ref >90)
Glucose, Bld: 183 mg/dL — ABNORMAL HIGH (ref 70–99)
POTASSIUM: 4.4 meq/L (ref 3.7–5.3)
Sodium: 144 mEq/L (ref 137–147)

## 2013-02-04 LAB — PROTIME-INR
INR: 1.13 (ref 0.00–1.49)
PROTHROMBIN TIME: 14.3 s (ref 11.6–15.2)

## 2013-02-04 MED ORDER — ENOXAPARIN SODIUM 120 MG/0.8ML ~~LOC~~ SOLN
110.0000 mg | SUBCUTANEOUS | Status: DC
  Administered 2013-02-05: 110 mg via SUBCUTANEOUS
  Filled 2013-02-04 (×5): qty 0.8

## 2013-02-04 MED ORDER — FUROSEMIDE 10 MG/ML IJ SOLN: 40.0000 mg | Freq: Two times a day (BID) | INTRAMUSCULAR | Status: DC

## 2013-02-04 MED ORDER — FUROSEMIDE 10 MG/ML IJ SOLN
20.0000 mg | Freq: Once | INTRAMUSCULAR | Status: AC
  Administered 2013-02-04: 20 mg via INTRAVENOUS
  Filled 2013-02-04: qty 2

## 2013-02-04 MED ORDER — FUROSEMIDE 10 MG/ML IJ SOLN
60.0000 mg | Freq: Two times a day (BID) | INTRAMUSCULAR | Status: DC
  Administered 2013-02-04 – 2013-02-06 (×4): 60 mg via INTRAVENOUS
  Filled 2013-02-04 (×4): qty 6

## 2013-02-04 MED ORDER — WARFARIN SODIUM 5 MG PO TABS
5.0000 mg | ORAL_TABLET | Freq: Once | ORAL | Status: DC
  Filled 2013-02-04: qty 1

## 2013-02-04 NOTE — Progress Notes (Signed)
Patient ID: [name]  female  [id number]    DOB: Jan 19, 1930    DOA: 01/31/2013  PCP: [name]., MD    Assessment/Plan: Principal Problem: 1. Acute respiratory failure secondary to acute on chronic diastolic heart failure/ Health care associated pneumonia:  Patient had progressively worsened since yesterday evening, was placed on BiPAP and given Lasix. Chest x-ray had shown pulmonary edema. Overnight patient became unresponsive and MD discussed with the patient's family who were aware of the patient's poor prognosis, and inclined towards comfort care.  This morning, patient was more alert, awake and responsive to questions, in presence of family friend in the room. Labs reviewed, BNP 27,216.  Discussed in detail with patient's daughter, [name], who reported that she and her family had several of these discussions with Ms. Tocci when patient was undergoing hemodialysis and renal transplant and they are very much aware of her prognosis. They are okay with the continuing the current management and comfort care orders, IV Lasix today. They are hopeful that patient may improve but are realistic and okay with complete comfort care tomorrow if not improving.  - Continue IV cefepime for now, vancomycin discontinued, on scheduled xopenex and atrovent   2. New Onset Afib with RVR: Rate controlled - Patient was started on Cardizem drip, currently off. Currently on metoprolol, digoxin - Continue therapeutic Lovenox and Coumadin per pharmacy - 2d ECHO on 1/15 head showed EF of 60-65% with grade 2 diastolic dysfunction  3. Acute LLE DVT:  - on therapeutic Lovenox, coumadin as per pharmacy.   4 H/o Renal Transplant:  - cont immunosuppressants.   5Klebsiella pneumonia UTI:  - Urine culture reviewed, continue cefepime  6 Diabetes Mellitus:  - Hemoglobin A1c 7.1, continue moderate sliding scale insulin, meal coverage  7 Neutropenia; improving  DVT Prophylaxis: Lovenox and  Coumadin  Code Status: DO NOT RESUSCITATE  Family Communication: Discussed with the [name] Disposition:      Subjective: Overnight events noted. Currently patient on facemask, alert and awake  Objective: Weight change:   Intake/Output Summary (Last 24 hours) at 02/04/13 1154 Last data filed at 02/04/13 1011  Gross per 24 hour  Intake    200 ml  Output      0 ml  Net    200 ml   Blood pressure 189/67, pulse 86, temperature 98.2 F (36.8 C), temperature source Oral, resp. rate 24, height 5\' 8"  (1.727 m), weight 72 kg (158 lb 11.7 oz), SpO2 94.00%.  Physical Exam: General: Alert and awake, able to tell her name, more responsive this morning CVS: S1-S2 clear Chest: Bilateral rhonchorous breath sounds with crackles Abdomen: soft nontender, nondistended, normal bowel sounds  Extremities: no cyanosis, clubbing. 1+ edema L>R   Lab Results: Basic Metabolic Panel:  Recent Labs Lab 02/02/13 0514 02/04/13 0456  NA 136* 144  K 4.1 4.4  CL 98 102  CO2 25 27  GLUCOSE 229* 183*  BUN 43* 44*  CREATININE 1.40* 1.32*  CALCIUM 9.2 9.7   Liver Function Tests:  Recent Labs Lab 01/31/13 1825 02/01/13 0424  AST 17 12  ALT 10 8  ALKPHOS 84 70  BILITOT 0.6 0.4  PROT 7.7 6.3  ALBUMIN 3.3* 2.6*   No results found for this basename: LIPASE, AMYLASE,  in the last 168 hours No results found for this basename: AMMONIA,  in the last 168 hours CBC:  Recent Labs Lab 02/01/13 0424 02/02/13 0514 02/04/13 0456  WBC 1.3* 2.7* 3.1*  NEUTROABS 1.1*  --   --  HGB 10.1* 10.3* 11.0*  HCT 31.5* 31.8* 34.7*  MCV 83.6 83.7 84.6  PLT 175 200 163   Cardiac Enzymes:  Recent Labs Lab 01/31/13 2314  TROPONINI <0.30   BNP: No components found with this basename: POCBNP,  CBG:  Recent Labs Lab 02/02/13 2045 02/03/13 0732 02/03/13 1117 02/03/13 1614 02/04/13 0740  GLUCAP 151* 254* 163* 111* 196*     Micro Results: Recent Results (from the past 240 hour(s))  URINE  CULTURE     Status: None   Collection Time    01/31/13  7:30 PM      Result Value Range Status   Specimen Description URINE, CATHETERIZED   Final   Special Requests NONE   Final   Culture  Setup Time     Final   Value: 01/31/2013 22:30     Performed at [facility]     Final   Value: >=100,000 COLONIES/ML     Performed at [facility]     Final   Value: KLEBSIELLA PNEUMONIAE     Performed at [facility]   Report Status 02/02/2013 FINAL   Final   Organism ID, Bacteria KLEBSIELLA PNEUMONIAE   Final    Studies/Results: Ct Abdomen Pelvis Wo Contrast  01/15/2013   CLINICAL DATA:  Fall.  Altered mental status.  EXAM: CT ABDOMEN AND PELVIS WITHOUT CONTRAST  TECHNIQUE: Multidetector CT imaging of the abdomen and pelvis was performed following the standard protocol without intravenous contrast.  COMPARISON:  None.  FINDINGS: The patient has large bilateral pleural effusions. There is cardiomegaly. No pericardial effusion. Compressive atelectasis in lung bases is noted.  The right kidney is markedly atrophic with multiple cysts identified. There is a cystic structure in the left upper quadrant with dense rim calcification which may represent the patient's left kidney or adrenal gland. The left adrenal gland is not definitely visualized. There is a right lower quadrant renal transplant with 2-3 small lobules of gas the renal collecting system likely related to Foley catheterization. The patient is status post cholecystectomy. The liver, spleen and pancreas are unremarkable. Extensive atherosclerotic vascular disease is seen. The patient is status post hysterectomy. There is a trace amount of free pelvic fluid. The stomach and small and large bowel appear normal. Diffuse body wall edema is noted. The patient is status post hysterectomy. No lymphadenopathy is seen. No fracture is seen. No lytic or sclerotic bony lesion is seen with multilevel lumbar  spondylosis noted.  IMPRESSION: Large appearing bilateral pleural effusions with associated compressive basilar atelectasis.  Cardiomegaly.  Trace amount of free pelvic fluid is nonspecific but could be due to IV fluid resuscitation or anasarca with diffuse body wall edema noted.  Tiny amount of air in the right lower quadrant renal transplant is likely related to Foley catheterization.   Electronically Signed   By: Inge Rise M.D.   On: 01/15/2013 05:52   Dg Chest 1 View  01/31/2013   CLINICAL DATA:  Shortness of breath, CHF  EXAM: CHEST - 1 VIEW  COMPARISON:  01/17/2013  FINDINGS: Rotated to the left.  Enlargement of cardiac silhouette.  Atherosclerotic calcification aortic arch.  Slight pulmonary vascular congestion.  Interstitial infiltrates likely pulmonary edema, is improved from previous exam.  Increased bibasilar atelectasis and again noted small bibasilar effusions.  No pneumothorax.  Bones demineralized with orthopedic hardware at the proximal left humerus.  IMPRESSION: Minimal CHF improved from previous exam.  Bibasilar atelectasis and small  pleural effusions.   Electronically Signed   By: Lavonia Dana M.D.   On: 01/31/2013 17:21   Ct Head Wo Contrast  01/15/2013   CLINICAL DATA:  Altered mental status.  Status post fall.  EXAM: CT HEAD WITHOUT CONTRAST  CT CERVICAL SPINE WITHOUT CONTRAST  TECHNIQUE: Multidetector CT imaging of the head and cervical spine was performed following the standard protocol without intravenous contrast. Multiplanar CT image reconstructions of the cervical spine were also generated.  COMPARISON:  Cervical spine CT scan 02/28/2003. Brain MRI 07/23/2005.  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 Chronic microvascular ischemic change is seen. No evidence of acute abnormality including infarction, hemorrhage, mass lesion, mass effect, midline shift or abnormal extra-axial fluid collection. Mucosal thickening bilateral maxillary  sinuses and is seen. There is some scattered ethmoid air cell disease. Prosthetic left globe is noted. The calvarium is intact.  CT CERVICAL SPINE FINDINGS  No fracture is identified. Mild anterolisthesis C4 on C7 and C7 on T1 due to facet arthropathy is noted. Anterior endplate spurring is seen. The patient has large appearing bilateral pleural effusions. Interlobular septal thickening in the apices is noted.  IMPRESSION: No acute finding head or cervical spine.  Chronic microvascular ischemic change and remote right parieto-occipital infarct.  Large appearing bilateral pleural effusions and interlobular septal thickening compatible with pulmonary edema.   Electronically Signed   By: Inge Rise M.D.   On: 01/15/2013 05:46   Ct Angio Chest Pe W/cm &/or Wo Cm  01/31/2013   CLINICAL DATA:  History of renal transplant, shortness of breath.  EXAM: CT ANGIOGRAPHY CHEST WITH CONTRAST  TECHNIQUE: Multidetector CT imaging of the chest was performed using the standard protocol during bolus administration of intravenous contrast. Multiplanar CT image reconstructions including MIPs were obtained to evaluate the vascular anatomy.  CONTRAST:  55mL OMNIPAQUE IOHEXOL 350 MG/ML SOLN  COMPARISON:  01/31/2013 radiograph, 01/15/2013 abdomen CT  FINDINGS: The pulmonary arterial vessels are patent.  Mild ectasia of the ascending aorta up to 3.6 cm, tapering along the articular normal caliber. There is scattered atherosclerotic disease of the aorta and branch vessels.  Mild cardiac enlargement. Coronary artery and aortic valvular calcifications. Trace pericardial fluid and/or thickening.  Moderate to large bilateral pleural effusions. Associated airspace consolidations.  Reactive sized intrathoracic lymph nodes. Heterogeneous thyroid gland with multiple small nodules.  Upper abdominal images show a 3.1 cm peripherally calcified ovoid lesion in the region of the left adrenal gland, indeterminate however unchanged from the recent  prior.  Interlobular septal thickening and hazy airspace opacities. Linear opacities within the middle lobe and lingula, favored to reflect atelectasis.  Multilevel degenerative changes, flowing anterior osteophytes. No acute osseous finding.  Review of the MIP images confirms the above findings.  IMPRESSION: No pulmonary embolism.  Pulmonary edema/ CHF pattern.  Moderate to large bilateral pleural effusions with associated airspace consolidations; atelectasis versus pneumonia.   Electronically Signed   By: Carlos Levering M.D.   On: 01/31/2013 22:55   Ct Cervical Spine Wo Contrast  01/15/2013   CLINICAL DATA:  Altered mental status.  Status post fall.  EXAM: CT HEAD WITHOUT CONTRAST  CT CERVICAL SPINE WITHOUT CONTRAST  TECHNIQUE: Multidetector CT imaging of the head and cervical spine was performed following the standard protocol without intravenous contrast. Multiplanar CT image reconstructions of the cervical spine were also generated.  COMPARISON:  Cervical spine CT scan 02/28/2003. Brain MRI 07/23/2005.  FINDINGS: CT HEAD FINDINGS  Remote right parieto-occipital infarct is  identified. Chronic microvascular ischemic change is seen. No evidence of acute abnormality including infarction, hemorrhage, mass lesion, mass effect, midline shift or abnormal extra-axial fluid collection. Mucosal thickening bilateral maxillary sinuses and is seen. There is some scattered ethmoid air cell disease. Prosthetic left globe is noted. The calvarium is intact.  CT CERVICAL SPINE FINDINGS  No fracture is identified. Mild anterolisthesis C4 on C7 and C7 on T1 due to facet arthropathy is noted. Anterior endplate spurring is seen. The patient has large appearing bilateral pleural effusions. Interlobular septal thickening in the apices is noted.  IMPRESSION: No acute finding head or cervical spine.  Chronic microvascular ischemic change and remote right parieto-occipital infarct.  Large appearing bilateral pleural effusions and  interlobular septal thickening compatible with pulmonary edema.   Electronically Signed   By: Inge Rise M.D.   On: 01/15/2013 05:46   US Venous Img Lower Bilateral  01/15/2013   CLINICAL DATA:  Bilateral lower extremity edema.  EXAM: BILATERAL LOWER EXTREMITY VENOUS DOPPLER ULTRASOUND  TECHNIQUE: Gray-scale sonography with graded compression, as well as color Doppler and duplex ultrasound, were performed to evaluate the deep venous system from the level of the common femoral vein through the popliteal and proximal calf veins. Spectral Doppler was utilized to evaluate flow at rest and with distal augmentation maneuvers.  COMPARISON:  None.  FINDINGS: Thrombus within deep veins: No DVT is identified in the right lower extremity. The left femoral vein is partially compressible in the mid thigh and over a segment demonstrates evidence of nonocclusive mural thrombus. Part of this thrombus appears echogenic and this may represent chronic thrombus. No occlusive thrombus is identified in the left lower extremity.  Duplex waveform respiratory phasicity:  Normal.  Duplex waveform response to augmentation:  Normal.  Venous reflux:  None visualized.  Other findings: No evidence of superficial thrombophlebitis or abnormal fluid collection.  IMPRESSION: Nonocclusive mural thrombus in the femoral vein of the left thigh. Based on appearance, this may be chronic thrombus.   Electronically Signed   By: Aletta Edouard M.D.   On: 01/15/2013 11:21   US Venous Img Lower Unilateral Right  01/31/2013   CLINICAL DATA:  Right lower extremity pain.  EXAM: RIGHT LOWER EXTREMITY VENOUS DOPPLER ULTRASOUND  TECHNIQUE: Gray-scale sonography with graded compression, as well as color Doppler and duplex ultrasound, were performed to evaluate the deep venous system from the level of the common femoral vein through the popliteal and proximal calf veins. Spectral Doppler was utilized to evaluate flow at rest and with distal augmentation  maneuvers.  COMPARISON:  US VENOUS IMG LOWER  BILATERAL dated 01/15/2013  FINDINGS: Thrombus within deep veins: Nonocclusive thrombus is again identified in the femoral vein at the level of the mid to distal thigh. There does appear to be some progression since the prior ultrasound study with some nonocclusive thrombus now also evident in the popliteal vein, which is new.  Duplex waveform respiratory phasicity:  Normal.  Duplex waveform response to augmentation:  Normal.  Venous reflux:  None visualized.  Other findings: No evidence of superficial thrombophlebitis or abnormal fluid collection.  IMPRESSION: Nonocclusive thrombus in the femoral and popliteal vein of the right lower extremity. This appears progressive since the prior study on 01/15/2013 and there likely is a component of acute DVT which may have propagated from a segment of more chronic appearing thrombus previously.   Electronically Signed   By: Aletta Edouard M.D.   On: 01/31/2013 15:59   Dg Chest [facility]  01/17/2013   CLINICAL DATA:  CHF and bilateral pleural effusions.  EXAM: PORTABLE CHEST - 1 VIEW  COMPARISON:  01/16/2013  FINDINGS: Less prominent pleural effusion and lower lobe atelectasis on the left with residual atelectasis/ consolidation remaining as well as some remaining left pleural fluid. The right pleural effusion is relatively stable. Perihilar atelectasis on the right has improved. Interstitial edema is relatively stable.  IMPRESSION: Less prominent left pleural effusion and left lower lobe atelectasis. Decrease in right perihilar atelectasis.   Electronically Signed   By: Aletta Edouard M.D.   On: 01/17/2013 08:00   Dg Chest Port 1 View  01/16/2013   CLINICAL DATA:  CHF  EXAM: PORTABLE CHEST - 1 VIEW  COMPARISON:  01/15/2013  FINDINGS: Heart remains enlarged with diffuse edema pattern throughout the lungs. Enlarging pleural effusions present. Increased right hilar and bibasilar atelectasis/ collapse. No pneumothorax.  Previous left proximal humerus ORIF for a remote fracture.  IMPRESSION: Persistent CHF pattern.  Enlarging pleural effusions with worsening bibasilar compressive atelectasis/collapse.  Increased right perihilar atelectasis as well.   Electronically Signed   By: Daryll Brod M.D.   On: 01/16/2013 07:48   Dg Chest Port 1 View  01/15/2013   CLINICAL DATA:  Altered mental status.  Fall, weakness.  EXAM: PORTABLE CHEST - 1 VIEW  COMPARISON:  Plain film of the chest 05/06/2007.  FINDINGS: There is extensive bilateral airspace disease. Pleural effusions are identified and appear greater on the right. Cardiomegaly is seen.  IMPRESSION: Appears the chest most compatible with congestive heart failure with associated pleural effusions and basilar atelectasis.   Electronically Signed   By: Inge Rise M.D.   On: 01/15/2013 05:05    Medications: Scheduled Meds: . ALPRAZolam  1 mg Oral QHS  . calcitRIOL  0.25 mcg Oral Q M,W,F  . calcium carbonate  1 tablet Oral BID WC  . ceFEPime (MAXIPIME) IV  1 g Intravenous Q24H  . digoxin  0.125 mg Oral Daily  . enoxaparin (LOVENOX) injection  75 mg Subcutaneous Q24H  . furosemide  20 mg Intravenous Once  . furosemide  60 mg Intravenous BID  . gabapentin  100 mg Oral QHS  . insulin aspart  0-15 Units Subcutaneous TID WC  . insulin aspart  0-5 Units Subcutaneous QHS  . insulin aspart  3 Units Subcutaneous TID WC  . ipratropium  0.5 mg Nebulization TID   And  . levalbuterol  0.63 mg Nebulization TID  . LORazepam  0.5 mg Intravenous Once  . metoprolol tartrate  100 mg Oral BID  . sirolimus  1 mg Oral Daily  . sodium chloride  3 mL Intravenous Q12H  . tacrolimus  1.5 mg Oral BID  . Warfarin - Pharmacist Dosing Inpatient   Does not apply Q24H      LOS: 4 days   RAI,RIPUDEEP M.D. [facility] 02/04/2013, 11:54 AM Pager: [phone]  If 7PM-7AM, please contact night-coverage [email or web address] Password [email or web address]

## 2013-02-04 NOTE — Progress Notes (Signed)
[place] for Lovenox >> Coumadin Indication: VTE Treatment, Afib  Allergies  Allergen Reactions  . Codeine     REACTION: UNKNOWN REACTION  . Penicillins    Patient Measurements: Height: 5\' 8"  (172.7 cm) Weight: 158 lb 11.7 oz (72 kg) IBW/kg (Calculated) : 63.9  Vital Signs: Temp: 98.2 F (36.8 C) (01/30 0515) Temp src: Oral (01/30 0515) BP: 189/67 mmHg (01/30 0515) Pulse Rate: 86 (01/30 0515)  Labs:  Recent Labs  02/01/13 1500 02/02/13 0514 02/03/13 0502 02/04/13 0456  HGB  --  10.3*  --  11.0*  HCT  --  31.8*  --  34.7*  PLT  --  200  --  163  LABPROT  --  14.7 13.8 14.3  INR  --  1.17 1.08 1.13  HEPARINUNFRC 0.59 0.21*  --   --   CREATININE  --  1.40*  --  1.32*   Estimated Creatinine Clearance: 34.3 ml/min (by C-G formula based on Cr of 1.32).  Medical History: Past Medical History  Diagnosis Date  . ESRD (end stage renal disease)     etiology unknown, history of dialysis  . Shingles     October 2014  . Diabetes mellitus without complication    Medications:  Scheduled:  . ALPRAZolam  1 mg Oral QHS  . calcitRIOL  0.25 mcg Oral Q M,W,F  . calcium carbonate  1 tablet Oral BID WC  . ceFEPime (MAXIPIME) IV  1 g Intravenous Q24H  . digoxin  0.125 mg Oral Daily  . enoxaparin (LOVENOX) injection  75 mg Subcutaneous Q24H  . furosemide  60 mg Intravenous BID  . gabapentin  100 mg Oral QHS  . insulin aspart  0-15 Units Subcutaneous TID WC  . insulin aspart  0-5 Units Subcutaneous QHS  . insulin aspart  3 Units Subcutaneous TID WC  . ipratropium  0.5 mg Nebulization TID   And  . levalbuterol  0.63 mg Nebulization TID  . LORazepam  0.5 mg Intravenous Once  . metoprolol tartrate  100 mg Oral BID  . sirolimus  1 mg Oral Daily  . sodium chloride  3 mL Intravenous Q12H  . tacrolimus  1.5 mg Oral BID  . warfarin  5 mg Oral Once  . Warfarin - Pharmacist Dosing Inpatient   Does not apply Q24H   Assessment: 80 yo female  admitted with HCAP.  Dopplers + RLE DVT and she has been in Afib since admission.  CHADS2VASc= 4.  Lovenox --> warfarin overlap day #4/5.   INR at baseline.  Pt did not get Coumadin yesterday due to episode of unresponsiveness.  SCr has improved slightly today.  No bleeding noted.    Goal of Therapy:  INR 2-3 Monitor platelets by anticoagulation protocol: Yes   Plan:  Lovenox 1.5mg /Kg (110mg ) sq daily.  (for clcr > 30) Coumadin 5mg  po today x 1 at 4pm INR daily Overlap with Coumadin at least 5 days and until INR > 2 x 24h Monitor CBC  Vence Lalor A 02/04/2013,1:26 PM

## 2013-02-05 DIAGNOSIS — J189 Pneumonia, unspecified organism: Secondary | ICD-10-CM | POA: Diagnosis present

## 2013-02-05 LAB — GLUCOSE, CAPILLARY
GLUCOSE-CAPILLARY: 236 mg/dL — AB (ref 70–99)
GLUCOSE-CAPILLARY: 117 mg/dL — AB (ref 70–99)
Glucose-Capillary: 208 mg/dL — ABNORMAL HIGH (ref 70–99)
Glucose-Capillary: 158 mg/dL — ABNORMAL HIGH (ref 70–99)

## 2013-02-05 LAB — PROTIME-INR
INR: 1.25 (ref 0.00–1.49)
Prothrombin Time: 15.4 seconds — ABNORMAL HIGH (ref 11.6–15.2)

## 2013-02-05 MED ORDER — METOPROLOL TARTRATE 1 MG/ML IV SOLN
5.0000 mg | Freq: Four times a day (QID) | INTRAVENOUS | Status: DC
  Administered 2013-02-05: 5 mg via INTRAVENOUS
  Filled 2013-02-05: qty 5

## 2013-02-05 MED ORDER — DILTIAZEM HCL 25 MG/5ML IV SOLN
5.0000 mg | Freq: Once | INTRAVENOUS | Status: AC
  Administered 2013-02-05: 5 mg via INTRAVENOUS
  Filled 2013-02-05 (×2): qty 5

## 2013-02-05 MED ORDER — DILTIAZEM HCL 25 MG/5ML IV SOLN
5.0000 mg | Freq: Once | INTRAVENOUS | Status: AC
  Administered 2013-02-05: 5 mg via INTRAVENOUS
  Filled 2013-02-05: qty 5

## 2013-02-05 MED ORDER — METOPROLOL TARTRATE 1 MG/ML IV SOLN
5.0000 mg | Freq: Four times a day (QID) | INTRAVENOUS | Status: DC | PRN
  Administered 2013-02-06: 5 mg via INTRAVENOUS
  Filled 2013-02-05 (×2): qty 5

## 2013-02-05 MED ORDER — WARFARIN SODIUM 5 MG PO TABS
5.0000 mg | ORAL_TABLET | Freq: Once | ORAL | Status: AC
  Administered 2013-02-05: 5 mg via ORAL

## 2013-02-05 NOTE — Progress Notes (Signed)
PT is doing well accept is 80 on room air , pulls oxygen off. Family is anxious as feels that she needs sedation she is not so anxious to RT

## 2013-02-05 NOTE — Progress Notes (Signed)
[facility] PROGRESS NOTE  [name] MCN:[id number] DOB: 1930/07/23 DOA: 01/31/2013 PCP: [name]., MD  Assessment/Plan: Principal Problem:  1. Acute respiratory failure secondary to acute on chronic diastolic heart failure/ Health care associated pneumonia:   This morning, patient was more alert, awake and responsive to questions, in presence of family friends in the room.  - Continue IV cefepime for now, vancomycin discontinued, on scheduled xopenex and atrovent  - With improvement in mentation will order speech therapy evaluation.  2. New Onset Afib with RVR: Rate controlled  - Patient was started on Cardizem drip, currently off. Currently on metoprolol, digoxin  - Continue therapeutic Lovenox and Coumadin per pharmacy  - 2d ECHO on 1/15 head showed EF of 60-65% with grade 2 diastolic dysfunction  - Given elevated heart rate will give one time Cardizem IV bolus x1  3. Acute LLE DVT:  - on therapeutic Lovenox, coumadin as per pharmacy.   4 H/o Renal Transplant:  - cont immunosuppressants.   5Klebsiella pneumonia UTI:  - Urine culture reviewed, continue cefepime   6 Diabetes Mellitus:  - Hemoglobin A1c 7.1, continue moderate sliding scale insulin, meal coverage   7 Neutropenia; improving  Code Status: DO NOT RESUSCITATE Family Communication: Discussed with patient and family members at bedside.  Disposition Plan: Pending current hospital course.   Consultants:  None  Procedures:  None  Antibiotics:  Cefepime  HPI/Subjective: No new complaints overnight. Family reports that patient is much more alert this morning.  Objective: Filed Vitals:   02/05/13 0549  BP: 165/76  Pulse: 139  Temp:   Resp:     Intake/Output Summary (Last 24 hours) at 02/05/13 1327 Last data filed at 02/05/13 6294  Gross per 24 hour  Intake    210 ml  Output   2950 ml  Net  -2740 ml   Filed Weights   02/01/13 0300 02/01/13 0500  Weight: 70.4 kg (155 lb 3.3 oz)  72 kg (158 lb 11.7 oz)    Exam:   General:  Patient is alert and awake  Cardiovascular: Irregularly irregular fast rate, no murmurs  Respiratory: No wheezes, nasal cannula in place  Abdomen: Soft, nondistended  Musculoskeletal: No cyanosis or clubbing  Data Reviewed: Basic Metabolic Panel:  Recent Labs Lab 01/31/13 1825 02/01/13 0424 02/02/13 0514 02/04/13 0456  NA 137 136* 136* 144  K 4.1 4.3 4.1 4.4  CL 95* 95* 98 102  CO2 28 24 25 27   GLUCOSE 236* 407* 229* 183*  BUN 25* 29* 43* 44*  CREATININE 1.08 1.17* 1.40* 1.32*  CALCIUM 10.1 9.0 9.2 9.7   Liver Function Tests:  Recent Labs Lab 01/31/13 1825 02/01/13 0424  AST 17 12  ALT 10 8  ALKPHOS 84 70  BILITOT 0.6 0.4  PROT 7.7 6.3  ALBUMIN 3.3* 2.6*   No results found for this basename: LIPASE, AMYLASE,  in the last 168 hours No results found for this basename: AMMONIA,  in the last 168 hours CBC:  Recent Labs Lab 01/31/13 1825 02/01/13 0424 02/02/13 0514 02/04/13 0456  WBC 3.7* 1.3* 2.7* 3.1*  NEUTROABS 3.0 1.1*  --   --   HGB 11.5* 10.1* 10.3* 11.0*  HCT 35.1* 31.5* 31.8* 34.7*  MCV 83.6 83.6 83.7 84.6  PLT 203 175 200 163   Cardiac Enzymes:  Recent Labs Lab 01/31/13 2314  TROPONINI <0.30   BNP (last 3 results)  Recent Labs  01/15/13 0516 01/31/13 2314 02/04/13 0456  PROBNP 13226.0* 12081.0* 76546.5*  CBG:  Recent Labs Lab 02/04/13 0740 02/04/13 1216 02/04/13 1732 02/04/13 2100 02/05/13 0745  GLUCAP 196* 165* 115* 149* 208*    Recent Results (from the past 240 hour(s))  URINE CULTURE     Status: None   Collection Time    01/31/13  7:30 PM      Result Value Range Status   Specimen Description URINE, CATHETERIZED   Final   Special Requests NONE   Final   Culture  Setup Time     Final   Value: 01/31/2013 22:30     Performed at [facility]     Final   Value: >=100,000 COLONIES/ML     Performed at [facility]   Culture     Final    Value: KLEBSIELLA PNEUMONIAE     Performed at [facility]   Report Status 02/02/2013 FINAL   Final   Organism ID, Bacteria KLEBSIELLA PNEUMONIAE   Final     Studies: Dg Chest Port 1 View  02/03/2013   CLINICAL DATA:  Acute dyspnea  EXAM: PORTABLE CHEST - 1 VIEW  COMPARISON:  Chest radiograph January 31, 2013 and CT angiogram of the chest January 31, 2013  FINDINGS: Worsening diffuse interstitial prominence, with patchy perihilar and right greater than left lung base airspace opacities. Small to moderate right pleural effusion with hemidiaphragm juxtapeaking. No pneumothorax.  The cardiac silhouette appears mildly enlarged. Calcified aortic knob. Left humeral intra medullary rod. Multiple EKG lines overlie the patient and may obscure subtle underlying pathology.  IMPRESSION: Worsening interstitial and alveolar airspace opacities concerning for pulmonary edema with right subpulmonic pleural effusion.  Stable mild cardiomegaly.   Electronically Signed   By: Elon Alas   On: 02/03/2013 16:05    Scheduled Meds: . ALPRAZolam  1 mg Oral QHS  . calcitRIOL  0.25 mcg Oral Q M,W,F  . calcium carbonate  1 tablet Oral BID WC  . ceFEPime (MAXIPIME) IV  1 g Intravenous Q24H  . digoxin  0.125 mg Oral Daily  . diltiazem  5 mg Intravenous Once  . enoxaparin (LOVENOX) injection  110 mg Subcutaneous Q24H  . furosemide  60 mg Intravenous BID  . gabapentin  100 mg Oral QHS  . insulin aspart  0-15 Units Subcutaneous TID WC  . insulin aspart  0-5 Units Subcutaneous QHS  . insulin aspart  3 Units Subcutaneous TID WC  . ipratropium  0.5 mg Nebulization TID   And  . levalbuterol  0.63 mg Nebulization TID  . LORazepam  0.5 mg Intravenous Once  . metoprolol tartrate  100 mg Oral BID  . sirolimus  1 mg Oral Daily  . sodium chloride  3 mL Intravenous Q12H  . tacrolimus  1.5 mg Oral BID  . warfarin  5 mg Oral Once  . warfarin  5 mg Oral Once  . Warfarin - Pharmacist Dosing Inpatient   Does not  apply Q24H   Continuous Infusions:   Principal Problem:   Atrial fibrillation with RVR Active Problems:   HYPERTENSION   Sepsis secondary to UTI   Acute diastolic heart failure   Pneumonia   Atrial fibrillation with rapid ventricular response   DVT (deep venous thrombosis)   DM (diabetes mellitus)   Acute on chronic diastolic CHF (congestive heart failure), NYHA class 1    Time spent: > 35 minutes    [name]  [facility] Pager [phone]. If 7PM-7AM, please contact night-coverage at [email or web address], password [email or web address] 02/05/2013,  1:27 PM  LOS: 5 days

## 2013-02-05 NOTE — Progress Notes (Signed)
[place] for Lovenox >> Coumadin Indication: VTE Treatment, Afib  Allergies  Allergen Reactions  . Codeine     REACTION: UNKNOWN REACTION  . Penicillins    Patient Measurements: Height: 5\' 8"  (172.7 cm) Weight: 158 lb 11.7 oz (72 kg) IBW/kg (Calculated) : 63.9  Vital Signs: Temp: 98.3 F (36.8 C) (01/31 0243) Temp src: Oral (01/31 0243) BP: 165/76 mmHg (01/31 0549) Pulse Rate: 139 (01/31 0549)  Labs:  Recent Labs  02/03/13 0502 02/04/13 0456 02/05/13 0615  HGB  --  11.0*  --   HCT  --  34.7*  --   PLT  --  163  --   LABPROT 13.8 14.3 15.4*  INR 1.08 1.13 1.25  CREATININE  --  1.32*  --    Estimated Creatinine Clearance: 34.3 ml/min (by C-G formula based on Cr of 1.32).  Medical History: Past Medical History  Diagnosis Date  . ESRD (end stage renal disease)     etiology unknown, history of dialysis  . Shingles     October 2014  . Diabetes mellitus without complication    Medications:  Scheduled:  . ALPRAZolam  1 mg Oral QHS  . calcitRIOL  0.25 mcg Oral Q M,W,F  . calcium carbonate  1 tablet Oral BID WC  . ceFEPime (MAXIPIME) IV  1 g Intravenous Q24H  . digoxin  0.125 mg Oral Daily  . enoxaparin (LOVENOX) injection  110 mg Subcutaneous Q24H  . furosemide  60 mg Intravenous BID  . gabapentin  100 mg Oral QHS  . insulin aspart  0-15 Units Subcutaneous TID WC  . insulin aspart  0-5 Units Subcutaneous QHS  . insulin aspart  3 Units Subcutaneous TID WC  . ipratropium  0.5 mg Nebulization TID   And  . levalbuterol  0.63 mg Nebulization TID  . LORazepam  0.5 mg Intravenous Once  . metoprolol tartrate  100 mg Oral BID  . sirolimus  1 mg Oral Daily  . sodium chloride  3 mL Intravenous Q12H  . tacrolimus  1.5 mg Oral BID  . warfarin  5 mg Oral Once  . warfarin  5 mg Oral Once  . Warfarin - Pharmacist Dosing Inpatient   Does not apply Q24H   Assessment: 80 yo female admitted with HCAP.  Dopplers + RLE DVT and she has  been in Afib since admission.  CHADS2VASc= 4.  Lovenox --> warfarin overlap day #5/5.  Pt did not get Coumadin yesterday due to nursing report of pt not being able to take PO meds therefore INR is not at goal.  Unclear if pt will be able to take Coumadin today but will order dose per protocol.  F/U plan for ongoing anticoagulation per MD.  Goal of Therapy:  INR 2-3 Monitor platelets by anticoagulation protocol: Yes   Plan:  Lovenox 1.5mg /Kg (110mg ) sq daily.  (for clcr > 30) Coumadin 5mg  po today x 1 at 4pm (if pt able to take) INR daily Overlap Lovenox with Coumadin until INR > 2 x 24h (past 5 days) Monitor CBC  [place] Crane, Nylani Michetti A 02/05/2013,11:37 AM

## 2013-02-05 NOTE — Progress Notes (Signed)
Late entry:  Pt family at bedside and state that the physician and family decided to maintain comfort care for the patient for the rest of the evening and reevaluate pt condition tomorrow.  Pt currently conversing with family but is only oriented to self.  Family state this is the most awake state she has been in in the past 24 hours.  Family and offgoing RN state that pt has been unable to take PO medications or eat today.  Pt requests a drink of water.  Pt given water after through oral care and has difficulty drinking via straw.  Pt swallows without sign of aspiration approximatly 60cc.  It appears pt will be unable to take PO medications at this point.  Will continue to monitor.

## 2013-02-06 DIAGNOSIS — J189 Pneumonia, unspecified organism: Secondary | ICD-10-CM

## 2013-02-06 DIAGNOSIS — I10 Essential (primary) hypertension: Secondary | ICD-10-CM

## 2013-02-06 LAB — GLUCOSE, CAPILLARY
Glucose-Capillary: 208 mg/dL — ABNORMAL HIGH (ref 70–99)
Glucose-Capillary: 139 mg/dL — ABNORMAL HIGH (ref 70–99)
Glucose-Capillary: 154 mg/dL — ABNORMAL HIGH (ref 70–99)
Glucose-Capillary: 183 mg/dL — ABNORMAL HIGH (ref 70–99)

## 2013-02-06 MED ORDER — DILTIAZEM HCL 25 MG/5ML IV SOLN
5.0000 mg | Freq: Once | INTRAVENOUS | Status: AC
  Administered 2013-02-06: 5 mg via INTRAVENOUS
  Filled 2013-02-06: qty 5

## 2013-02-06 MED ORDER — WARFARIN SODIUM 5 MG PO TABS: 5.0000 mg | ORAL_TABLET | Freq: Once | ORAL | Status: DC

## 2013-02-06 NOTE — Progress Notes (Signed)
[place] for Lovenox >> Coumadin Indication: VTE Treatment, Afib  Allergies  Allergen Reactions  . Codeine     REACTION: UNKNOWN REACTION  . Penicillins    Patient Measurements: Height: 5\' 8"  (172.7 cm) Weight: 158 lb 11.7 oz (72 kg) IBW/kg (Calculated) : 63.9  Vital Signs:    Labs:  Recent Labs  02/04/13 0456 02/05/13 0615  HGB 11.0*  --   HCT 34.7*  --   PLT 163  --   LABPROT 14.3 15.4*  INR 1.13 1.25  CREATININE 1.32*  --    Estimated Creatinine Clearance: 34.3 ml/min (by C-G formula based on Cr of 1.32).  Medical History: Past Medical History  Diagnosis Date  . ESRD (end stage renal disease)     etiology unknown, history of dialysis  . Shingles     October 2014  . Diabetes mellitus without complication    Medications:  Scheduled:  . ALPRAZolam  1 mg Oral QHS  . calcitRIOL  0.25 mcg Oral Q M,W,F  . calcium carbonate  1 tablet Oral BID WC  . ceFEPime (MAXIPIME) IV  1 g Intravenous Q24H  . digoxin  0.125 mg Oral Daily  . enoxaparin (LOVENOX) injection  110 mg Subcutaneous Q24H  . furosemide  60 mg Intravenous BID  . gabapentin  100 mg Oral QHS  . insulin aspart  0-15 Units Subcutaneous TID WC  . insulin aspart  0-5 Units Subcutaneous QHS  . insulin aspart  3 Units Subcutaneous TID WC  . ipratropium  0.5 mg Nebulization TID   And  . levalbuterol  0.63 mg Nebulization TID  . LORazepam  0.5 mg Intravenous Once  . metoprolol tartrate  100 mg Oral BID  . sirolimus  1 mg Oral Daily  . sodium chloride  3 mL Intravenous Q12H  . tacrolimus  1.5 mg Oral BID  . warfarin  5 mg Oral Once  . Warfarin - Pharmacist Dosing Inpatient   Does not apply Q24H   Assessment: 80 yo female admitted with HCAP.  Dopplers + RLE DVT and she has been in Afib since admission.  CHADS2VASc= 4.  Lovenox --> warfarin overlap beyond day 5.  INR is not at goal due to pt being unable to take PO's periodically but pt did get dose yesterday per  report.  Unclear if pt will be able to take Coumadin today but will order dose per protocol.  F/U plan for ongoing anticoagulation per MD.  Goal of Therapy:  INR 2-3 Monitor platelets by anticoagulation protocol: Yes   Plan:  Lovenox 1.5mg /Kg (110mg ) sq daily.  (for clcr > 30) Coumadin 5mg  po today x 1 at 4pm (if pt able to take) INR daily Overlap Lovenox with Coumadin until INR > 2 x 24h (past 5 days) Monitor CBC  [place] Crane, Mlissa Tamayo A 02/06/2013,9:43 AM

## 2013-02-06 NOTE — Progress Notes (Signed)
PT is for most part comfort care.

## 2013-02-06 NOTE — Progress Notes (Signed)
[facility] PROGRESS NOTE  [name] SAY:[id number] DOB: 12-11-1930 DOA: 01/31/2013 PCP: [name]., MD  Assessment/Plan: Principal Problem:  1. Acute respiratory failure secondary to acute on chronic diastolic heart failure/ Health care associated pneumonia:  - Per discussion with family will continue comfort care measures  2. New Onset Afib with RVR: Rate controlled  - Patient has been in and out of afib. Family is requesting we decrease amount of time with that the patient's vital signs are checked - Again will continue comfort care measures  3. Acute LLE DVT:  - on therapeutic Lovenox, coumadin as per pharmacy.  - Will continue Lovenox and Coumadin for now but may consider discontinuing moving forward.  4 H/o Renal Transplant:  - cont immunosuppressants for now.   5Klebsiella pneumonia UTI:  - Urine culture reviewed, continue cefepime more for comfort measures.  6 Diabetes Mellitus:  - Hemoglobin A1c 7.1, continue moderate sliding scale insulin, meal coverage - If this contributes to patient discomfort we'll consider discontinuing   Code Status: DO NOT RESUSCITATE Family Communication: Discussed with patient and family members at bedside.  Disposition Plan: Pending current hospital course.   Consultants:  None  Procedures:  None  Antibiotics:  Cefepime  HPI/Subjective: Discuss case with family this morning who reiterates that the family is at peace with current situation and prognosis and would like patient to be comfortable.  Objective: Filed Vitals:   02/05/13 2120  BP: 173/55  Pulse: 90  Resp: 16    Intake/Output Summary (Last 24 hours) at 02/06/13 1151 Last data filed at 02/06/13 1134  Gross per 24 hour  Intake    165 ml  Output   3100 ml  Net  -2935 ml   Filed Weights   02/01/13 0300 02/01/13 0500  Weight: 70.4 kg (155 lb 3.3 oz) 72 kg (158 lb 11.7 oz)    Exam:   General:  Patient is alert and awake  Cardiovascular:  Irregularly irregular fast rate, no murmurs  Respiratory: No wheezes, nasal cannula in place  Abdomen: Soft, nondistended  Musculoskeletal: No cyanosis or clubbing  Data Reviewed: Basic Metabolic Panel:  Recent Labs Lab 01/31/13 1825 02/01/13 0424 02/02/13 0514 02/04/13 0456  NA 137 136* 136* 144  K 4.1 4.3 4.1 4.4  CL 95* 95* 98 102  CO2 28 24 25 27   GLUCOSE 236* 407* 229* 183*  BUN 25* 29* 43* 44*  CREATININE 1.08 1.17* 1.40* 1.32*  CALCIUM 10.1 9.0 9.2 9.7   Liver Function Tests:  Recent Labs Lab 01/31/13 1825 02/01/13 0424  AST 17 12  ALT 10 8  ALKPHOS 84 70  BILITOT 0.6 0.4  PROT 7.7 6.3  ALBUMIN 3.3* 2.6*   No results found for this basename: LIPASE, AMYLASE,  in the last 168 hours No results found for this basename: AMMONIA,  in the last 168 hours CBC:  Recent Labs Lab 01/31/13 1825 02/01/13 0424 02/02/13 0514 02/04/13 0456  WBC 3.7* 1.3* 2.7* 3.1*  NEUTROABS 3.0 1.1*  --   --   HGB 11.5* 10.1* 10.3* 11.0*  HCT 35.1* 31.5* 31.8* 34.7*  MCV 83.6 83.6 83.7 84.6  PLT 203 175 200 163   Cardiac Enzymes:  Recent Labs Lab 01/31/13 2314  TROPONINI <0.30   BNP (last 3 results)  Recent Labs  01/15/13 0516 01/31/13 2314 02/04/13 0456  PROBNP 13226.0* 12081.0* 27216.0*   CBG:  Recent Labs Lab 02/05/13 1309 02/05/13 1636 02/05/13 2123 02/06/13 0734 02/06/13 1132  GLUCAP 158* 236* 117*  208* 139*    Recent Results (from the past 240 hour(s))  URINE CULTURE     Status: None   Collection Time    01/31/13  7:30 PM      Result Value Range Status   Specimen Description URINE, CATHETERIZED   Final   Special Requests NONE   Final   Culture  Setup Time     Final   Value: 01/31/2013 22:30     Performed at [facility]     Final   Value: >=100,000 COLONIES/ML     Performed at [facility]   Culture     Final   Value: KLEBSIELLA PNEUMONIAE     Performed at [facility]   Report Status 02/02/2013  FINAL   Final   Organism ID, Bacteria KLEBSIELLA PNEUMONIAE   Final     Studies: No results found.  Scheduled Meds: . ALPRAZolam  1 mg Oral QHS  . ceFEPime (MAXIPIME) IV  1 g Intravenous Q24H  . digoxin  0.125 mg Oral Daily  . enoxaparin (LOVENOX) injection  110 mg Subcutaneous Q24H  . gabapentin  100 mg Oral QHS  . insulin aspart  0-15 Units Subcutaneous TID WC  . insulin aspart  0-5 Units Subcutaneous QHS  . insulin aspart  3 Units Subcutaneous TID WC  . ipratropium  0.5 mg Nebulization TID   And  . levalbuterol  0.63 mg Nebulization TID  . LORazepam  0.5 mg Intravenous Once  . metoprolol tartrate  100 mg Oral BID  . sirolimus  1 mg Oral Daily  . sodium chloride  3 mL Intravenous Q12H  . tacrolimus  1.5 mg Oral BID  . warfarin  5 mg Oral Once  . Warfarin - Pharmacist Dosing Inpatient   Does not apply Q24H   Continuous Infusions:   Principal Problem:   HCAP (healthcare-associated pneumonia) Active Problems:   HYPERTENSION   Sepsis secondary to UTI   Acute diastolic heart failure   Pneumonia   Atrial fibrillation with RVR   Atrial fibrillation with rapid ventricular response   DVT (deep venous thrombosis)   DM (diabetes mellitus)   Acute on chronic diastolic CHF (congestive heart failure), NYHA class 1    Time spent: > 35 minutes    [name]  [facility] Pager [phone]. If 7PM-7AM, please contact night-coverage at [email or web address], password [facility] 02/06/2013, 11:51 AM  LOS: 6 days

## 2013-02-07 LAB — PROTIME-INR
INR: 1.26 (ref 0.00–1.49)
Prothrombin Time: 15.5 seconds — ABNORMAL HIGH (ref 11.6–15.2)

## 2013-02-07 LAB — GLUCOSE, CAPILLARY: Glucose-Capillary: 143 mg/dL — ABNORMAL HIGH (ref 70–99)

## 2013-02-07 MED ORDER — MORPHINE SULFATE (CONCENTRATE) 10 MG /0.5 ML PO SOLN: 5.0000 mg | ORAL | Status: DC | PRN

## 2013-02-07 MED ORDER — LEVALBUTEROL HCL 0.63 MG/3ML IN NEBU: 0.6300 mg | INHALATION_SOLUTION | Freq: Four times a day (QID) | RESPIRATORY_TRACT | Status: AC | PRN

## 2013-02-07 MED ORDER — DIGOXIN 125 MCG PO TABS: 0.1250 mg | ORAL_TABLET | Freq: Every day | ORAL | Status: AC

## 2013-02-07 MED ORDER — METOPROLOL TARTRATE 50 MG PO TABS: 100.0000 mg | ORAL_TABLET | Freq: Two times a day (BID) | ORAL | Status: AC

## 2013-02-07 MED ORDER — LORAZEPAM 1 MG PO TABS: 1.0000 mg | ORAL_TABLET | ORAL | Status: DC | PRN

## 2013-02-07 MED ORDER — MORPHINE SULFATE (CONCENTRATE) 10 MG /0.5 ML PO SOLN: 5.0000 mg | ORAL | Status: AC | PRN

## 2013-02-07 MED ORDER — INSULIN ASPART 100 UNIT/ML ~~LOC~~ SOLN
0.0000 [IU] | Freq: Three times a day (TID) | SUBCUTANEOUS | Status: AC
Start: 2013-02-07 — End: ?

## 2013-02-07 MED ORDER — FUROSEMIDE 40 MG PO TABS: 40.0000 mg | ORAL_TABLET | Freq: Every day | ORAL | Status: AC | PRN

## 2013-02-07 MED ORDER — ALPRAZOLAM 1 MG PO TABS: 1.0000 mg | ORAL_TABLET | Freq: Every day | ORAL | Status: AC

## 2013-02-07 MED ORDER — ENOXAPARIN SODIUM 120 MG/0.8ML ~~LOC~~ SOLN: 110.0000 mg | SUBCUTANEOUS | Status: DC

## 2013-02-07 MED ORDER — LORAZEPAM 2 MG/ML PO CONC: 1.0000 mg | ORAL | Status: AC | PRN

## 2013-02-07 NOTE — Discharge Summary (Signed)
Physician Discharge Summary  Patient ID: [name] MRN: [id number] DOB/AGE: 1930-12-27 80 y.o.  Admit date: 01/31/2013 Discharge date: 02/07/2013  Primary Care Physician:  [name]., MD  Discharge Diagnoses:   . Acute respiratory failure - currently on room air . Atrial fibrillation with RVR . Comfort Care goals . HYPERTENSION . Sepsis secondary to UTI- resolved . Acute diastolic heart failure . history of renal transplants . 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 DM (diabetes mellitus) . HCAP (healthcare-associated pneumonia)  Consults: Cardiology, Dr. Harl Bowie   Recommendations for Outpatient Follow-up:   Patient is comfort care goals at this time    Allergies:   Allergies  Allergen Reactions  . Codeine     REACTION: UNKNOWN REACTION  . Penicillins      Discharge Medications:   Medication List    STOP taking these medications       amLODipine 10 MG tablet  Commonly known as:  NORVASC     calcitRIOL 0.25 MCG capsule  Commonly known as:  ROCALTROL     calcium carbonate 600 MG Tabs tablet  Commonly known as:  OS-CAL     diphenhydramine-acetaminophen 25-500 MG Tabs  Commonly known as:  TYLENOL PM     glimepiride 1 MG tablet  Commonly known as:  AMARYL     multivitamin tablet      TAKE these medications       ALPRAZolam 1 MG tablet  Commonly known as:  XANAX  Take 1 tablet (1 mg total) by mouth at bedtime.     digoxin 0.125 MG tablet  Commonly known as:  LANOXIN  Take 1 tablet (0.125 mg total) by mouth daily.     furosemide 40 MG tablet  Commonly known as:  LASIX  Take 1 tablet (40 mg total) by mouth daily as needed for fluid (shortness of breath).     gabapentin 100 MG capsule  Commonly known as:  NEURONTIN  Take 100 mg by mouth at bedtime.     insulin aspart 100 UNIT/ML injection  Commonly known as:  novoLOG  Inject 0-15 Units into the skin 3 (three) times daily with meals. Sliding scale  CBG  70 - 120: 0 units: CBG 121 - 150: 2 units; CBG 151 - 200: 3 units; CBG 201 - 250: 5 units; CBG 251 - 300: 8 units;CBG 301 - 350: 11 units; CBG 351 - 400: 15 units; CBG > 400 : 15 units and notify MD     levalbuterol 0.63 MG/3ML nebulizer solution  Commonly known as:  XOPENEX  Take 3 mLs (0.63 mg total) by nebulization every 6 (six) hours as needed for wheezing or shortness of breath.     LORazepam 2 MG/ML concentrated solution  Commonly known as:  ATIVAN  Take 0.5 mLs (1 mg total) by mouth every 4 (four) hours as needed for anxiety or sleep.     metoprolol 50 MG tablet  Commonly known as:  LOPRESSOR  Take 2 tablets (100 mg total) by mouth 2 (two) times daily.     morphine CONCENTRATE 10 mg / 0.5 ml concentrated solution  Place 0.25 mLs (5 mg total) under the tongue every 3 (three) hours as needed for severe pain or shortness of breath.     sirolimus 1 MG tablet  Commonly known as:  RAPAMUNE  Take 1 mg by mouth daily.     tacrolimus 0.5 MG capsule  Commonly known as:  PROGRAF  Take 0.5  mg by mouth 2 (two) times daily.     tacrolimus 1 MG capsule  Commonly known as:  PROGRAF  Take 1 mg by mouth 2 (two) times daily.         Brief H and P: For complete details please refer to admission H and P, but in brief 80 year old female who has a past medical history of ESRD (end stage renal disease); status post kidney transplant, Shingles; and Diabetes mellitus without complication, was sent from skilled nursing facility after patient developed shortness of breath. Patient had ultrasound of the lower extremity which showed nonocclusive thrombus in the femoral and popliteal vein of the right lower extremity. This appeared progressive since the study on 01/15/2013. Likely comparable for acute DVT. Patient recently was discharged from the hospital after she was treated for acute diastolic heart failure.  Patient denied any chest pain, no nausea vomiting or diarrhea no headache no blurred vision.  She did complain of shortness of breath on exertion.  In the ED patient was found to be in atrial fibrillation with rapid response, and has been started on Cardizem infusion.  Also patient has abnormal UA and received Cipro in the ED. CT angiogram of the chest is negative for pulmonary embolism.    Hospital Course:   1. Acute respiratory failure secondary to acute on chronic diastolic heart failure/ Health care associated pneumonia: Patient was initially admitted to step down unit, she was placed on IV Cardizem drip and IV digoxin per cardiology conditions. She improved somewhat in the interim and was transitioned to telemetry floor. However on 02/03/13, patient had progressively worsened with pulmonary edema and shortness of breath leading to acute respiratory failure. She was placed on BiPAP and given Lasix. Chest x-ray had shown pulmonary edema, BNP was 27,216.  Overnight patient became unresponsive and MD discussed with the patient's family who were aware of the patient's poor prognosis, and inclined towards comfort care.  Over the next few days, she has been more intermittently alert, awake and responsive to questions, in presence of family friend in the room. Her medical condition was discussed in detail multiple times with the family members were aware of her prognosis and requested for comfort care goals.  She was treated with IV vancomycin and cefepime which have been discontinued as patient has completed the course of antibiotics.  2. New Onset Afib with RVR: Rate controlled. Patient was started on Cardizem drip, currently off. Currently on metoprolol, digoxin. Patient was placed on Coumadin, however she has been refusing labs and was unable to take medications orally. Patient is currently with comfort care goals.  2d ECHO on 1/15 head showed EF of 60-65% with grade 2 diastolic dysfunction.   3. Acute LLE DVT: Patient was placed on therapeutic Lovenox and Coumadin,  currently comfort care.  Given comfort care goals, pharmacist discussed in detail regarding anticoagulation options with the patient's family. Patient's family wished for no additional medications, anticoagulation other than but will help keep the patient comfortable with her goals of comfort care.   4 H/o Renal Transplant: - cont immunosuppressants, per family's request.   5 Klebsiella pneumonia UTI: Patient has received 8 days of IV cefepime, no further need  6 Diabetes Mellitus: - Hemoglobin A1c 7.1, continue moderate sliding scale insulin, meal coverage      Day of Discharge BP 149/74  Pulse 146  Temp(Src) 98.3 F (36.8 C) (Oral)  Resp 20  Ht 5\' 8"  (1.727 m)  Wt 72 kg (158 lb 11.7 oz)  BMI 24.14 kg/m2  SpO2 96%  Physical Exam: General: Alert and awake oriented, NAD CVS: S1-S2 clear  Chest: Decreased breath sound at the bases Abdomen: soft nontender, nondistended, normal bowel sounds Extremities: no cyanosis, clubbing or edema noted bilaterally    The results of significant diagnostics from this hospitalization (including imaging, microbiology, ancillary and laboratory) are listed below for reference.    LAB RESULTS: Basic Metabolic Panel:  Recent Labs Lab 02/02/13 0514 02/04/13 0456  NA 136* 144  K 4.1 4.4  CL 98 102  CO2 25 27  GLUCOSE 229* 183*  BUN 43* 44*  CREATININE 1.40* 1.32*  CALCIUM 9.2 9.7   Liver Function Tests:  Recent Labs Lab 01/31/13 1825 02/01/13 0424  AST 17 12  ALT 10 8  ALKPHOS 84 70  BILITOT 0.6 0.4  PROT 7.7 6.3  ALBUMIN 3.3* 2.6*   No results found for this basename: LIPASE, AMYLASE,  in the last 168 hours No results found for this basename: AMMONIA,  in the last 168 hours CBC:  Recent Labs Lab 02/01/13 0424 02/02/13 0514 02/04/13 0456  WBC 1.3* 2.7* 3.1*  NEUTROABS 1.1*  --   --   HGB 10.1* 10.3* 11.0*  HCT 31.5* 31.8* 34.7*  MCV 83.6 83.7 84.6  PLT 175 200 163   Cardiac Enzymes:  Recent Labs Lab 01/31/13 2314  TROPONINI <0.30    BNP: No components found with this basename: POCBNP,  CBG:  Recent Labs Lab 02/06/13 2110 02/07/13 0749  GLUCAP 183* 143*    Significant Diagnostic Studies:  Dg Chest 1 View  01/31/2013   CLINICAL DATA:  Shortness of breath, CHF  EXAM: CHEST - 1 VIEW  COMPARISON:  01/17/2013  FINDINGS: Rotated to the left.  Enlargement of cardiac silhouette.  Atherosclerotic calcification aortic arch.  Slight pulmonary vascular congestion.  Interstitial infiltrates likely pulmonary edema, is improved from previous exam.  Increased bibasilar atelectasis and again noted small bibasilar effusions.  No pneumothorax.  Bones demineralized with orthopedic hardware at the proximal left humerus.  IMPRESSION: Minimal CHF improved from previous exam.  Bibasilar atelectasis and small pleural effusions.   Electronically Signed   By: Lavonia Dana M.D.   On: 01/31/2013 17:21   Ct Angio Chest Pe W/cm &/or Wo Cm  01/31/2013   CLINICAL DATA:  History of renal transplant, shortness of breath.  EXAM: CT ANGIOGRAPHY CHEST WITH CONTRAST  TECHNIQUE: Multidetector CT imaging of the chest was performed using the standard protocol during bolus administration of intravenous contrast. Multiplanar CT image reconstructions including MIPs were obtained to evaluate the vascular anatomy.  CONTRAST:  32mL OMNIPAQUE IOHEXOL 350 MG/ML SOLN  COMPARISON:  01/31/2013 radiograph, 01/15/2013 abdomen CT  FINDINGS: The pulmonary arterial vessels are patent.  Mild ectasia of the ascending aorta up to 3.6 cm, tapering along the articular normal caliber. There is scattered atherosclerotic disease of the aorta and branch vessels.  Mild cardiac enlargement. Coronary artery and aortic valvular calcifications. Trace pericardial fluid and/or thickening.  Moderate to large bilateral pleural effusions. Associated airspace consolidations.  Reactive sized intrathoracic lymph nodes. Heterogeneous thyroid gland with multiple small nodules.  Upper abdominal images show a  3.1 cm peripherally calcified ovoid lesion in the region of the left adrenal gland, indeterminate however unchanged from the recent prior.  Interlobular septal thickening and hazy airspace opacities. Linear opacities within the middle lobe and lingula, favored to reflect atelectasis.  Multilevel degenerative changes, flowing anterior osteophytes. No acute osseous finding.  Review of the MIP images confirms  the above findings.  IMPRESSION: No pulmonary embolism.  Pulmonary edema/ CHF pattern.  Moderate to large bilateral pleural effusions with associated airspace consolidations; atelectasis versus pneumonia.   Electronically Signed   By: Carlos Levering M.D.   On: 01/31/2013 22:55   US Venous Img Lower Unilateral Right  01/31/2013   CLINICAL DATA:  Right lower extremity pain.  EXAM: RIGHT LOWER EXTREMITY VENOUS DOPPLER ULTRASOUND  TECHNIQUE: Gray-scale sonography with graded compression, as well as color Doppler and duplex ultrasound, were performed to evaluate the deep venous system from the level of the common femoral vein through the popliteal and proximal calf veins. Spectral Doppler was utilized to evaluate flow at rest and with distal augmentation maneuvers.  COMPARISON:  US VENOUS IMG LOWER  BILATERAL dated 01/15/2013  FINDINGS: Thrombus within deep veins: Nonocclusive thrombus is again identified in the femoral vein at the level of the mid to distal thigh. There does appear to be some progression since the prior ultrasound study with some nonocclusive thrombus now also evident in the popliteal vein, which is new.  Duplex waveform respiratory phasicity:  Normal.  Duplex waveform response to augmentation:  Normal.  Venous reflux:  None visualized.  Other findings: No evidence of superficial thrombophlebitis or abnormal fluid collection.  IMPRESSION: Nonocclusive thrombus in the femoral and popliteal vein of the right lower extremity. This appears progressive since the prior study on 01/15/2013 and there  likely is a component of acute DVT which may have propagated from a segment of more chronic appearing thrombus previously.   Electronically Signed   By: Aletta Edouard M.D.   On: 01/31/2013 15:59    2D ECHO:  Study Conclusions  - Left ventricle: The cavity size was normal. Wall thickness was increased in a pattern of moderate LVH. Systolic function was normal. The estimated ejection fraction was in the range of 60% to 65%. Wall motion was normal; there were no regional wall motion abnormalities. Features are consistent with a pseudonormal left ventricular filling pattern, with concomitant abnormal relaxation and increased filling pressure (grade 2 diastolic dysfunction). Doppler parameters are consistent with high ventricular filling pressure. - Aortic valve: Mildly calcified annulus. Trileaflet; mildly thickened, mildly calcified leaflets. - Mitral valve: No significant mitral valve prolapse noted. Calcified annulus. Mildly thickened leaflets . - Left atrium: The atrium was moderately dilated. - Right atrium: The atrium was mildly dilated. - Systemic veins: IVC mildly dilated with normal respirophasic response (estimated CVP of 8 mmHg). - Pericardium, extracardiac: Pleural effusion with stranding noted.   Disposition and Follow-up: Discharge Orders   Future Orders Complete By Expires   Diet Carb Modified  As directed        DISPOSITION: Residential hospice  DIET: Comfort care/comfort feeds    DISCHARGE FOLLOW-UP Follow-up Information   Follow up with [facility].   Contact information:   [place] [id number] [phone]       Follow up with [name]., MD. (As needed if symptoms worsen)    Specialty:  Internal Medicine   Contact information:   [address] [place] [address] [phone]       Time spent on Discharge: 40 mins  Signed:   Ellisha Bankson M.D. [facility] 02/07/2013, 3:54 PM Pager: [phone]

## 2013-02-07 NOTE — Progress Notes (Signed)
Nutrition Brief Note  Chart reviewed. Pt now transitioning to comfort care.  No further nutrition interventions warranted at this time.  Please re-consult as needed.   Nahun Kronberg A. Sarahelizabeth Conway, RD, LDN Pager: [phone]  

## 2013-02-07 NOTE — Clinical Social Work Note (Signed)
Meredith from [facility] met with pt's family and they are agreeable to transfer to facility today. Scripts and d/c summary sent with pt in packet. Brief support provided to family. Pt to transfer via [facility].  Benay Pike, [place]

## 2013-02-07 NOTE — Clinical Social Work Note (Signed)
CSW spoke w patient's son, explained that bed is available at hospice home, family wants patient transferred there as bed is currently available.  Ailene Ravel, from [facility], is coming to meet w family approx 2 PM today.  Clinicals faxed to hospice home for their review.  CSW Elaina Pattee will follow patient to discharge.  Edwyna Shell, LCSW Clinical Social Worker [phone])

## 2013-02-07 NOTE — Evaluation (Signed)
Clinical/Bedside Swallow Evaluation Patient Details  Name: [name] MRN: [id number] Date of Birth: Mar 20, 1930  Today's Date: 02/07/2013 Time: 1200-1225 SLP Time Calculation (min): 25 min  Past Medical History:  Past Medical History  Diagnosis Date  . ESRD (end stage renal disease)     etiology unknown, history of dialysis  . Shingles     October 2014  . Diabetes mellitus without complication    Past Surgical History:  Past Surgical History  Procedure Laterality Date  . Kidney transplant      about 2008  . Nephrectomy transplanted organ    . Dialysis fistula creation Left   . Prostetic eye Left     cornea damage  . Left arm fracture    . Cholecystectomy    . Abdominal hysterectomy     HPI:  Acute respiratory failure secondary to acute on chronic diastolic heart failure/ Health care associated pneumonia. Pt recently transitioned to comfort care, however family would like swallow evaluation to determine if she can take meds or not.   Assessment / Plan / Recommendation Clinical Impression  [name] is a delightful 80 yo woman who presents with minimal oral phase dysphagia with suspected lingual weakness which results in mild oral/dental residue with regular textures. She clears residuals with cue to follow with liquid wash. [name] was alert, cooperative, and able to follow all commands. She verbally expressed that she was hungry and named several foods that she enjoys eating. Family/friends in room during evaluation. Recommend D3/mech soft with thin liquids with cues to alternate solids and liquids as needed. Pt receptive to recommendations.    Aspiration Risk  Mild    Diet Recommendation Dysphagia 3 (Mechanical Soft);Thin liquid   Liquid Administration via: Cup;Straw Medication Administration: Crushed with puree Supervision: Patient able to self feed;Intermittent supervision to cue for compensatory strategies Compensations: Slow rate;Follow solids with  liquid Postural Changes and/or Swallow Maneuvers: Seated upright 90 degrees;Upright 30-60 min after meal;Out of bed for meals    Other  Recommendations Oral Care Recommendations: Oral care BID Other Recommendations: Clarify dietary restrictions   Follow Up Recommendations  None    Frequency and Duration min 1 x/week  1 week   Pertinent Vitals/Pain      Swallow Study Prior Functional Status   SNF    General Date of Onset: 01/31/13 HPI: Acute respiratory failure secondary to acute on chronic diastolic heart failure/ Health care associated pneumonia. Pt recently transitioned to comfort care, however family would like swallow evaluation to determine if she can take meds or not. Type of Study: Bedside swallow evaluation Diet Prior to this Study: Regular;Thin liquids Temperature Spikes Noted: No Respiratory Status: Nasal cannula History of Recent Intubation: No Behavior/Cognition: Alert;Cooperative;Pleasant mood Oral Cavity - Dentition: Adequate natural dentition Self-Feeding Abilities: Able to feed self Patient Positioning: Upright in bed Baseline Vocal Quality: Clear Volitional Cough: Strong;Congested Volitional Swallow: Able to elicit    Oral/Motor/Sensory Function Overall Oral Motor/Sensory Function: Appears within functional limits for tasks assessed   Ice Chips Ice chips: Within functional limits Presentation: Spoon   Thin Liquid Thin Liquid: Within functional limits Presentation: Cup;Straw;Self Fed    Nectar Thick Nectar Thick Liquid: Not tested   Honey Thick Honey Thick Liquid: Not tested   Puree Puree: Within functional limits Presentation: Spoon   Solid     Thank you,  Genene Churn, CCC-SLP [phone]     Solid: Impaired Presentation: Self Fed;Spoon Oral Phase Impairments: Reduced lingual movement/coordination Oral Phase Functional Implications: Oral residue  [name] 02/07/2013,2:26 PM

## 2013-02-07 NOTE — Progress Notes (Signed)
Pt alert. Saline lock intact. Foley intact. No complaints of any distress. Family at bedside. Report called to Santiago Glad, RN at hospice home. Caregiver verbalized understanding of instructions. Pt left floor via ems and transferred to the facility.

## 2013-02-07 NOTE — Progress Notes (Signed)
[facility] for Lovenox >> Coumadin Indication: VTE Treatment, Afib  Allergies  Allergen Reactions  . Codeine     REACTION: UNKNOWN REACTION  . Penicillins    Patient Measurements: Height: 5\' 8"  (172.7 cm) Weight: 158 lb 11.7 oz (72 kg) IBW/kg (Calculated) : 63.9  Vital Signs:    Labs:  Recent Labs  02/05/13 0615  LABPROT 15.4*  INR 1.25   Estimated Creatinine Clearance: 34.3 ml/min (by C-G formula based on Cr of 1.32).  Medical History: Past Medical History  Diagnosis Date  . ESRD (end stage renal disease)     etiology unknown, history of dialysis  . Shingles     October 2014  . Diabetes mellitus without complication    Medications:  Scheduled:  . ALPRAZolam  1 mg Oral QHS  . digoxin  0.125 mg Oral Daily  . enoxaparin (LOVENOX) injection  110 mg Subcutaneous Q24H  . gabapentin  100 mg Oral QHS  . insulin aspart  0-15 Units Subcutaneous TID WC  . insulin aspart  0-5 Units Subcutaneous QHS  . insulin aspart  3 Units Subcutaneous TID WC  . ipratropium  0.5 mg Nebulization TID   And  . levalbuterol  0.63 mg Nebulization TID  . metoprolol tartrate  100 mg Oral BID  . sirolimus  1 mg Oral Daily  . sodium chloride  3 mL Intravenous Q12H  . tacrolimus  1.5 mg Oral BID  . warfarin  5 mg Oral Once  . Warfarin - Pharmacist Dosing Inpatient   Does not apply Q24H   Assessment: 80 yo female admitted with HCAP.  Dopplers + RLE DVT and she has been in Afib since admission.  CHADS2VASc= 4.  Lovenox --> warfarin overlap beyond day 5.  INR remains sub-therapeutic due to pt being unable to take PO's periodically.  She has also refused INR blood draws for past 2 days.   Patient is comfort care.   Discussed patient with Dr Tana Coast who has discussed with family.  Family wishes to continue medications at this time, however Dr Tana Coast instructs not to order warfarin dose if patient refuses blood draw.  She will also get swallow eval per family  request.  Goal of Therapy:  INR 2-3 Monitor platelets by anticoagulation protocol: Yes   Plan:  Lovenox 1.5mg /Kg (110mg ) sq daily.  (for clcr > 30) No Coumadin today (b/c pt refusing lab draw) Overlap Lovenox with Coumadin until INR > 2 x 24h (past 5 days) Monitor CBC, INR, Bmet  Biagio Borg 02/07/2013,12:12 PM

## 2013-02-07 NOTE — Progress Notes (Signed)
Asked by Dr Tana Coast to discuss anticoagulation options with patient's family.  Spoke with patient's son (health care POA) and daughter-in-law and patient's husband.  We discussed risks vs. Benefits for lovenox-->coumadin, xarelto, no treatment.   Patient's son verbalized that he desires no treatment for VTE or Afib at this time.  He states his family wishes for no additional medications other than what will help keep his mom comfortable.   Netta Cedars, PharmD, BCPS 02/07/2013@3 :47 PM

## 2013-02-08 NOTE — Progress Notes (Signed)
UR chart review completed.  

## 2013-02-22 NOTE — Progress Notes (Addendum)
Patient ID: [name], female   DOB: 1930/05/26, 80 y.o.   MRN: [id number]               PROGRESS NOTE  DATE:  01/31/2013    FACILITY: [facility]    LEVEL OF CARE:   SNF   Acute Visit   CHIEF COMPLAINT:  Hypoxemia, question discharge.    HISTORY OF PRESENT ILLNESS:  I was asked to see [name] today about discharging her.  Subsequently, the staff found that her O2 sats were 75% and she was put on oxygen.    The patient is only complaining about cough.  Staff note that she seems to be weaker, not as alert.     PHYSICAL EXAMINATION:   VITAL SIGNS:   O2 SATURATIONS:  95%.   RESPIRATIONS:  18 and unlabored.   PULSE:  76.   BLOOD PRESSURE:  152/68.   TEMPERATURE:  99.   GENERAL APPEARANCE:  The patient is not in any distress.   CHEST/RESPIRATORY:  There are bibasilar crackles.   CARDIOVASCULAR:  CARDIAC:   Heart sounds are normal.  There are no murmurs.   GASTROINTESTINAL:  ABDOMEN:   Soft.   LIVER/SPLEEN/KIDNEYS:  No liver, no spleen.  No tenderness.   GENITOURINARY:  BLADDER:   No suprapubic or costovertebral angle tenderness.   CIRCULATION:   EDEMA/VARICOSITIES:  Extremities:  There is right leg greater than left leg edema.  She has venous stasis.  However, this is significantly different from when I saw her on admission.  She is on Lasix 80 mg.  There is also excessive warmth in the right leg.  Therefore, I am going to do a duplex ultrasound.    ASSESSMENT/PLAN:  Diastolically-mediated congestive heart failure.  I am going to give her an extra dose of Lasix, see if we can get a PA and lateral chest x-ray and a duplex ultrasound of the right leg.  I am concerned about her.  She looks really quite a bit worse than when I saw her for admission 10 days ago.   She is also on rejection medications for her kidney transplant.    The family was not interested in discharge once they realized that something was wrong with her.

## 2013-04-06 DEATH — deceased

## 2014-03-28 IMAGING — CT CT ABD-PELV W/O CM
2 of 4 series · 16 of 46 positions shown, 18 images · non-contrast
Comparison: None.

CLINICAL DATA: Fall.  Altered mental status.

EXAM:
CT ABDOMEN AND PELVIS WITHOUT CONTRAST
TECHNIQUE: Multidetector CT imaging of the abdomen and pelvis was performed
following the standard protocol without intravenous contrast.

[Series 2: abdomen/pelvis w/o contrast · axial · non-contrast · 0.68mm/px · z∈[-1290,-900]mm · 13 of 90 slices shown, 15 images]
[im 6/90  soft-tissue]
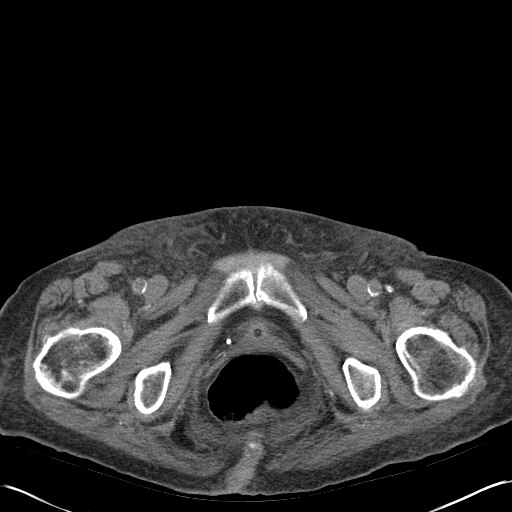
[im 6/90  bone]
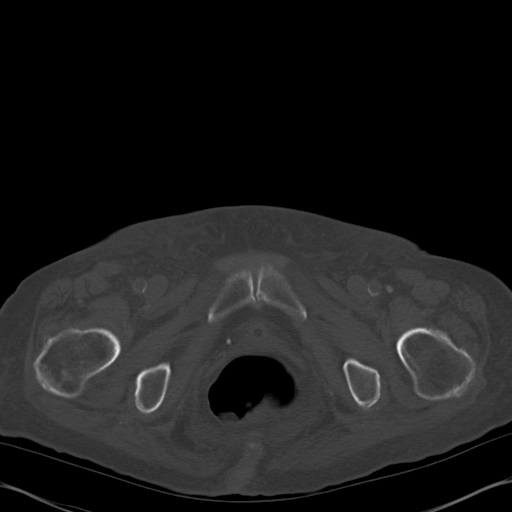
[im 12/90  soft-tissue]
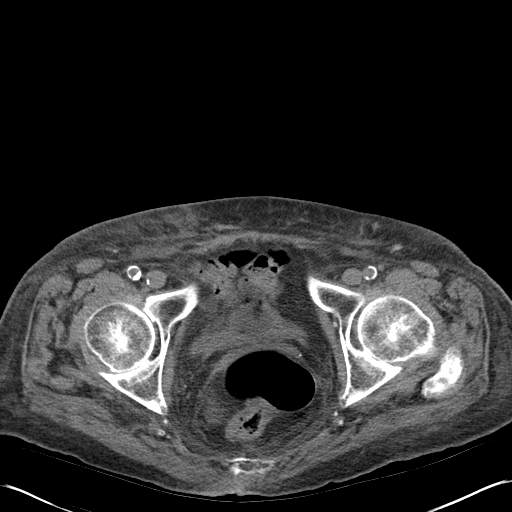
[im 18/90  soft-tissue]
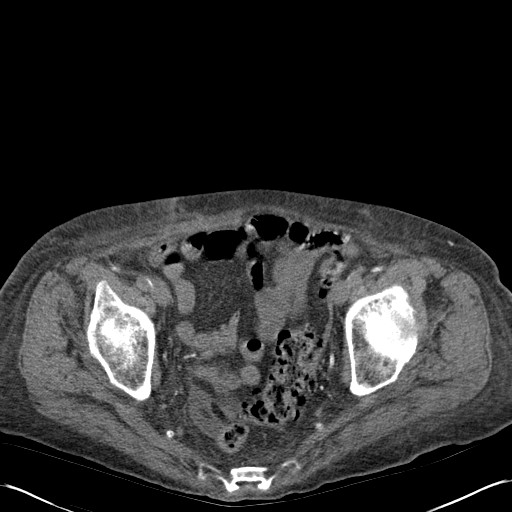
[im 24/90  soft-tissue]
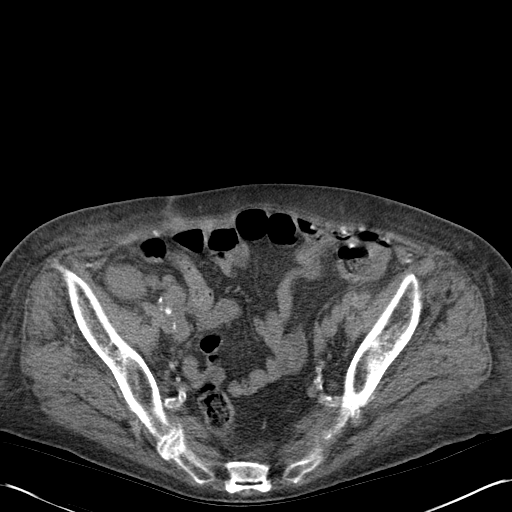
[im 30/90  soft-tissue]
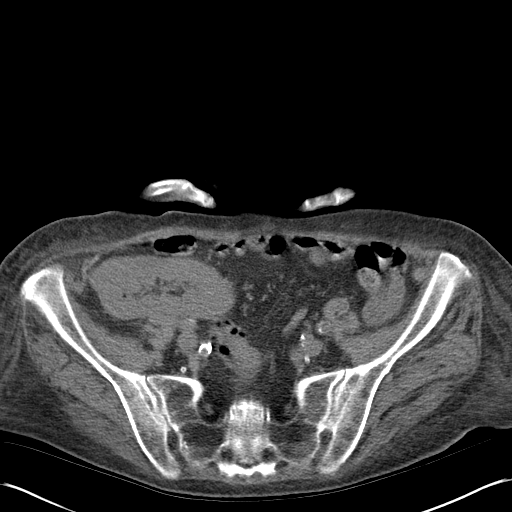
[im 36/90  soft-tissue]
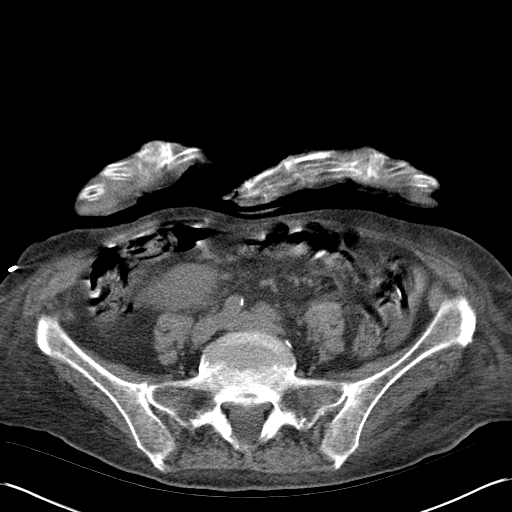
[im 48/90  soft-tissue]
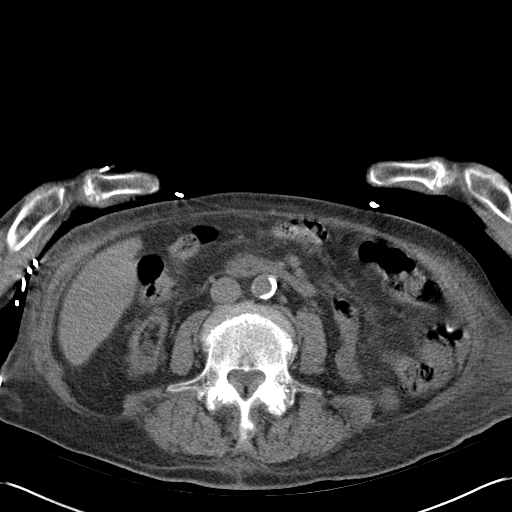
[im 54/90  soft-tissue]
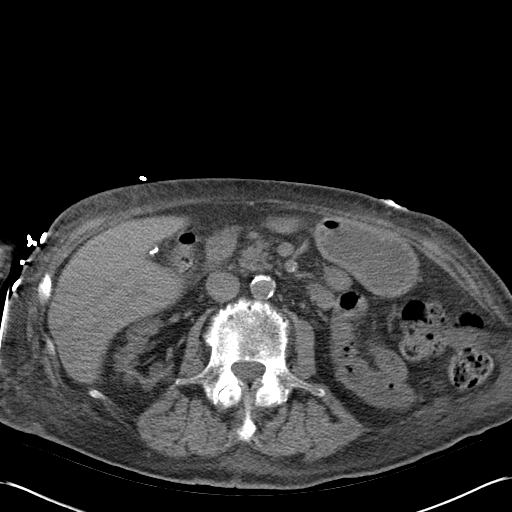
[im 60/90  soft-tissue]
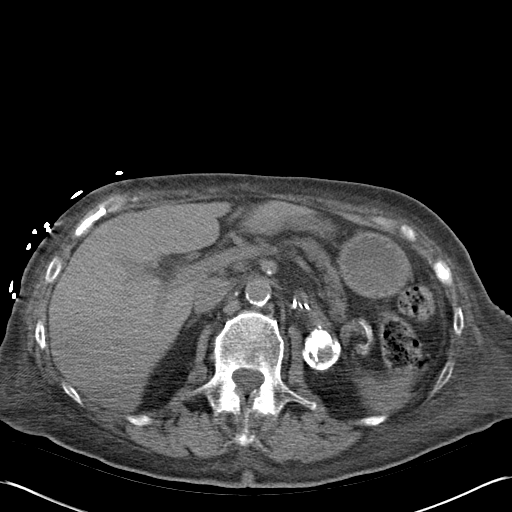
[im 60/90  bone]
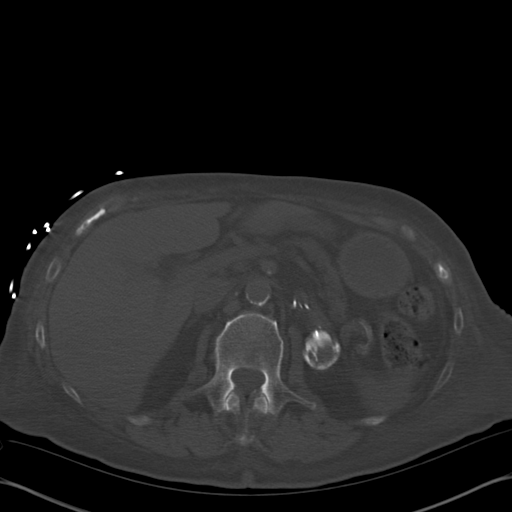
[im 66/90  soft-tissue]
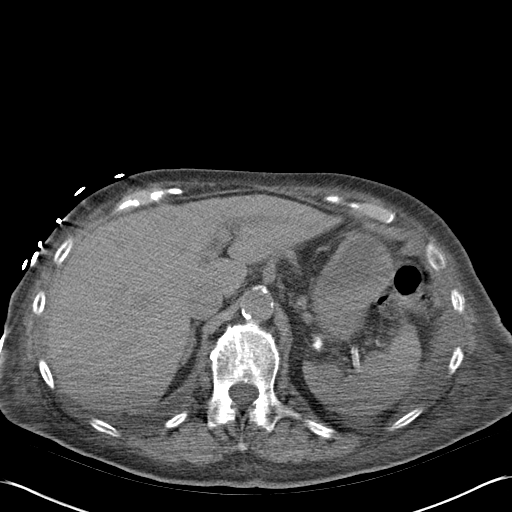
[im 72/90  soft-tissue]
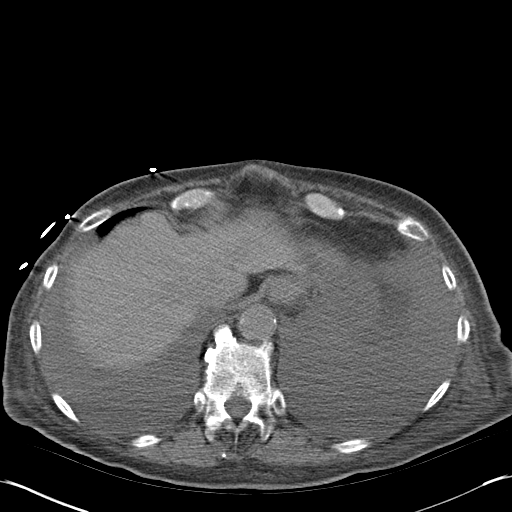
[im 78/90  soft-tissue]
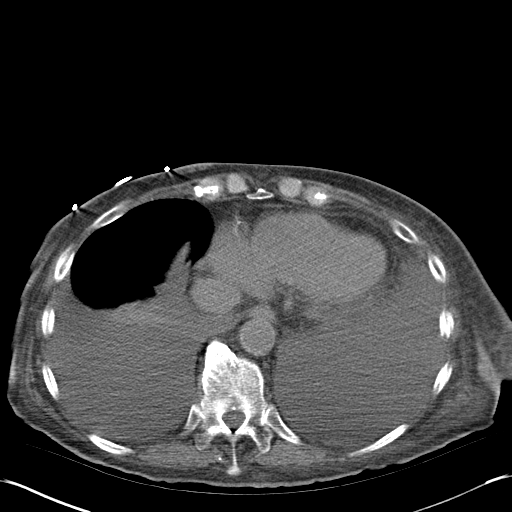
[im 84/90  soft-tissue]
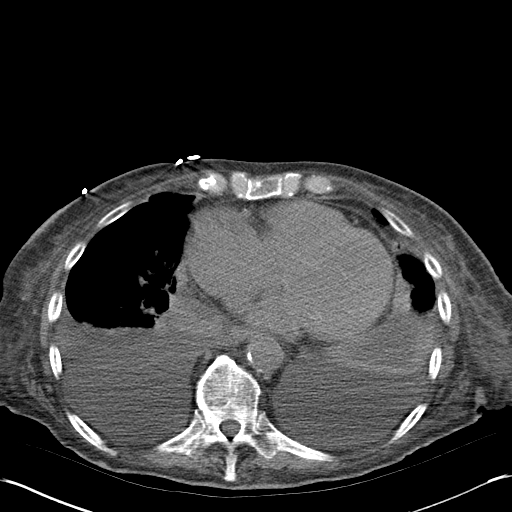

[Series 4: mpr cor (id) · coronal · 0.74mm/px · 3 of 102 slices shown]
[im 34/102  soft-tissue]
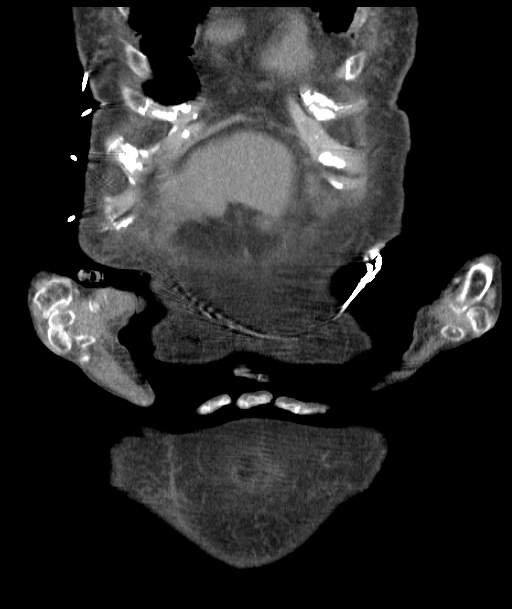
[im 45/102  soft-tissue]
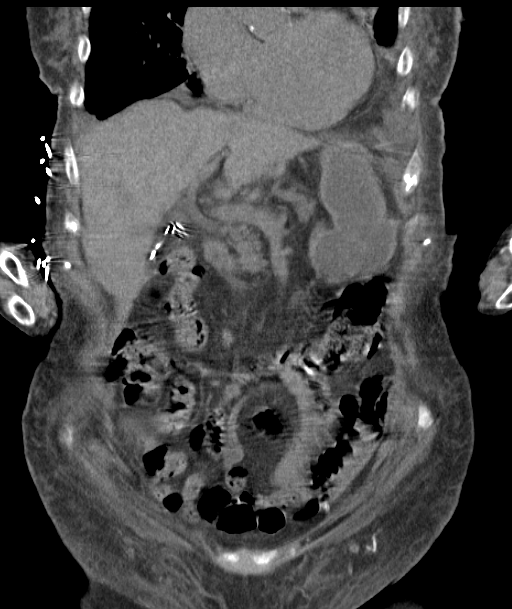
[im 57/102  soft-tissue]
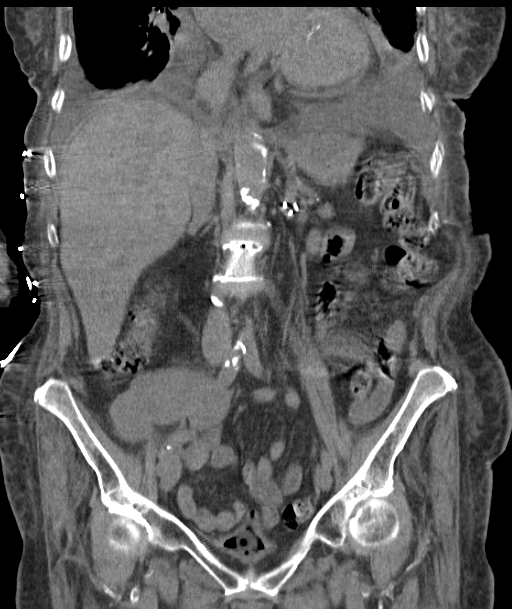

[16 of 46 positions shown; findings below may reference images not displayed]

FINDINGS: The patient has large bilateral pleural effusions. There is
cardiomegaly. No pericardial effusion. Compressive atelectasis in
lung bases is noted.

The right kidney is markedly atrophic with multiple cysts
identified. There is a cystic structure in the left upper quadrant
with dense rim calcification which may represent the patient's left
kidney or adrenal gland. The left adrenal gland is not definitely
visualized. There is a right lower quadrant renal transplant with
2-3 small lobules of gas the renal collecting system likely related
to Foley catheterization. The patient is status post
cholecystectomy. The liver, spleen and pancreas are unremarkable.
Extensive atherosclerotic vascular disease is seen. The patient is
status post hysterectomy. There is a trace amount of free pelvic
fluid. The stomach and small and large bowel appear normal. Diffuse
body wall edema is noted. The patient is status post hysterectomy.
No lymphadenopathy is seen. No fracture is seen. No lytic or
sclerotic bony lesion is seen with multilevel lumbar spondylosis
noted.
IMPRESSION: Large appearing bilateral pleural effusions with associated
compressive basilar atelectasis.

Cardiomegaly.

Trace amount of free pelvic fluid is nonspecific but could be due to
IV fluid resuscitation or anasarca with diffuse body wall edema
noted.

Tiny amount of air in the right lower quadrant renal transplant is
likely related to Foley catheterization.

## 2014-03-29 IMAGING — CR DG CHEST 1V PORT
1 series · 1 of 1 positions shown · non-contrast
Comparison: 01/15/2013

CLINICAL DATA: CHF

EXAM:
PORTABLE CHEST - 1 VIEW

[portable]
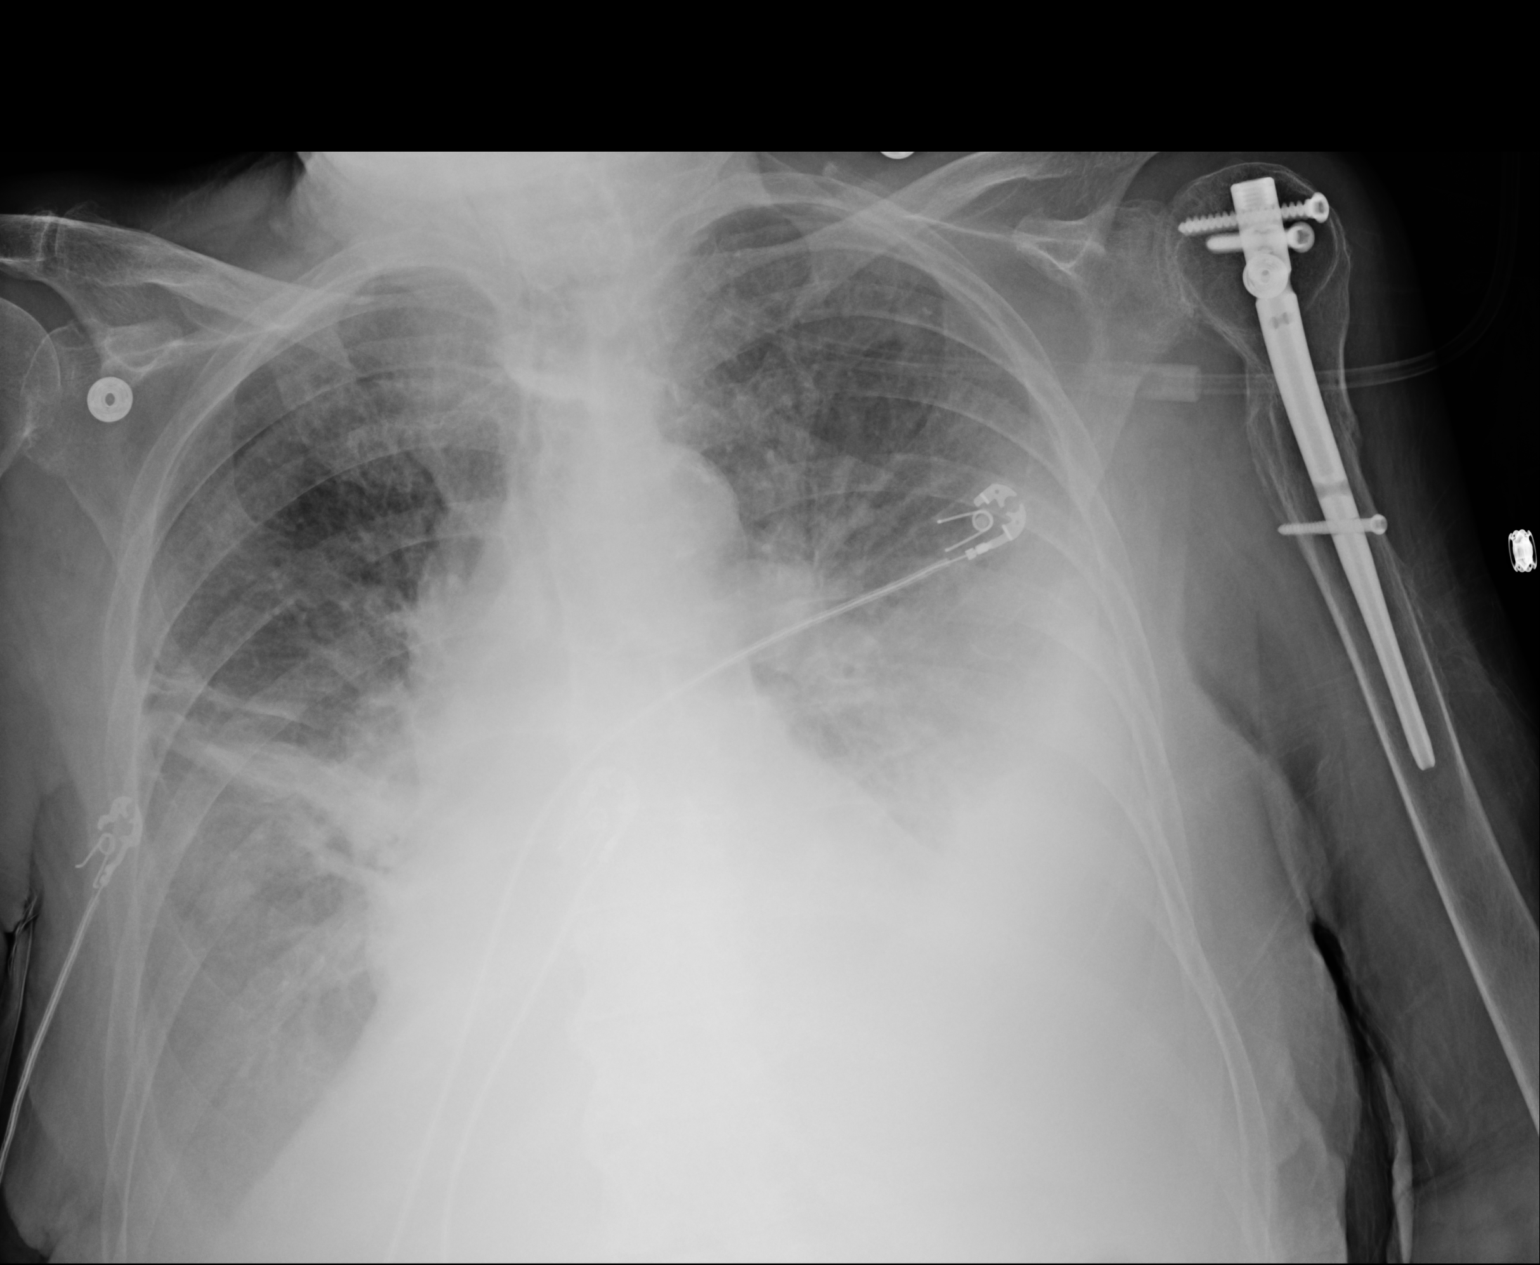

[1 of 1 positions shown; findings below may reference images not displayed]

FINDINGS: Heart remains enlarged with diffuse edema pattern throughout the
lungs. Enlarging pleural effusions present. Increased right hilar
and bibasilar atelectasis/ collapse. No pneumothorax. Previous left
proximal humerus ORIF for a remote fracture.
IMPRESSION: Persistent CHF pattern.

Enlarging pleural effusions with worsening bibasilar compressive
atelectasis/collapse.

Increased right perihilar atelectasis as well.

## 2014-03-30 IMAGING — CR DG CHEST 1V PORT
1 series · 1 of 1 positions shown · non-contrast
Comparison: 01/16/2013

CLINICAL DATA: CHF and bilateral pleural effusions.

EXAM:
PORTABLE CHEST - 1 VIEW

[portable]
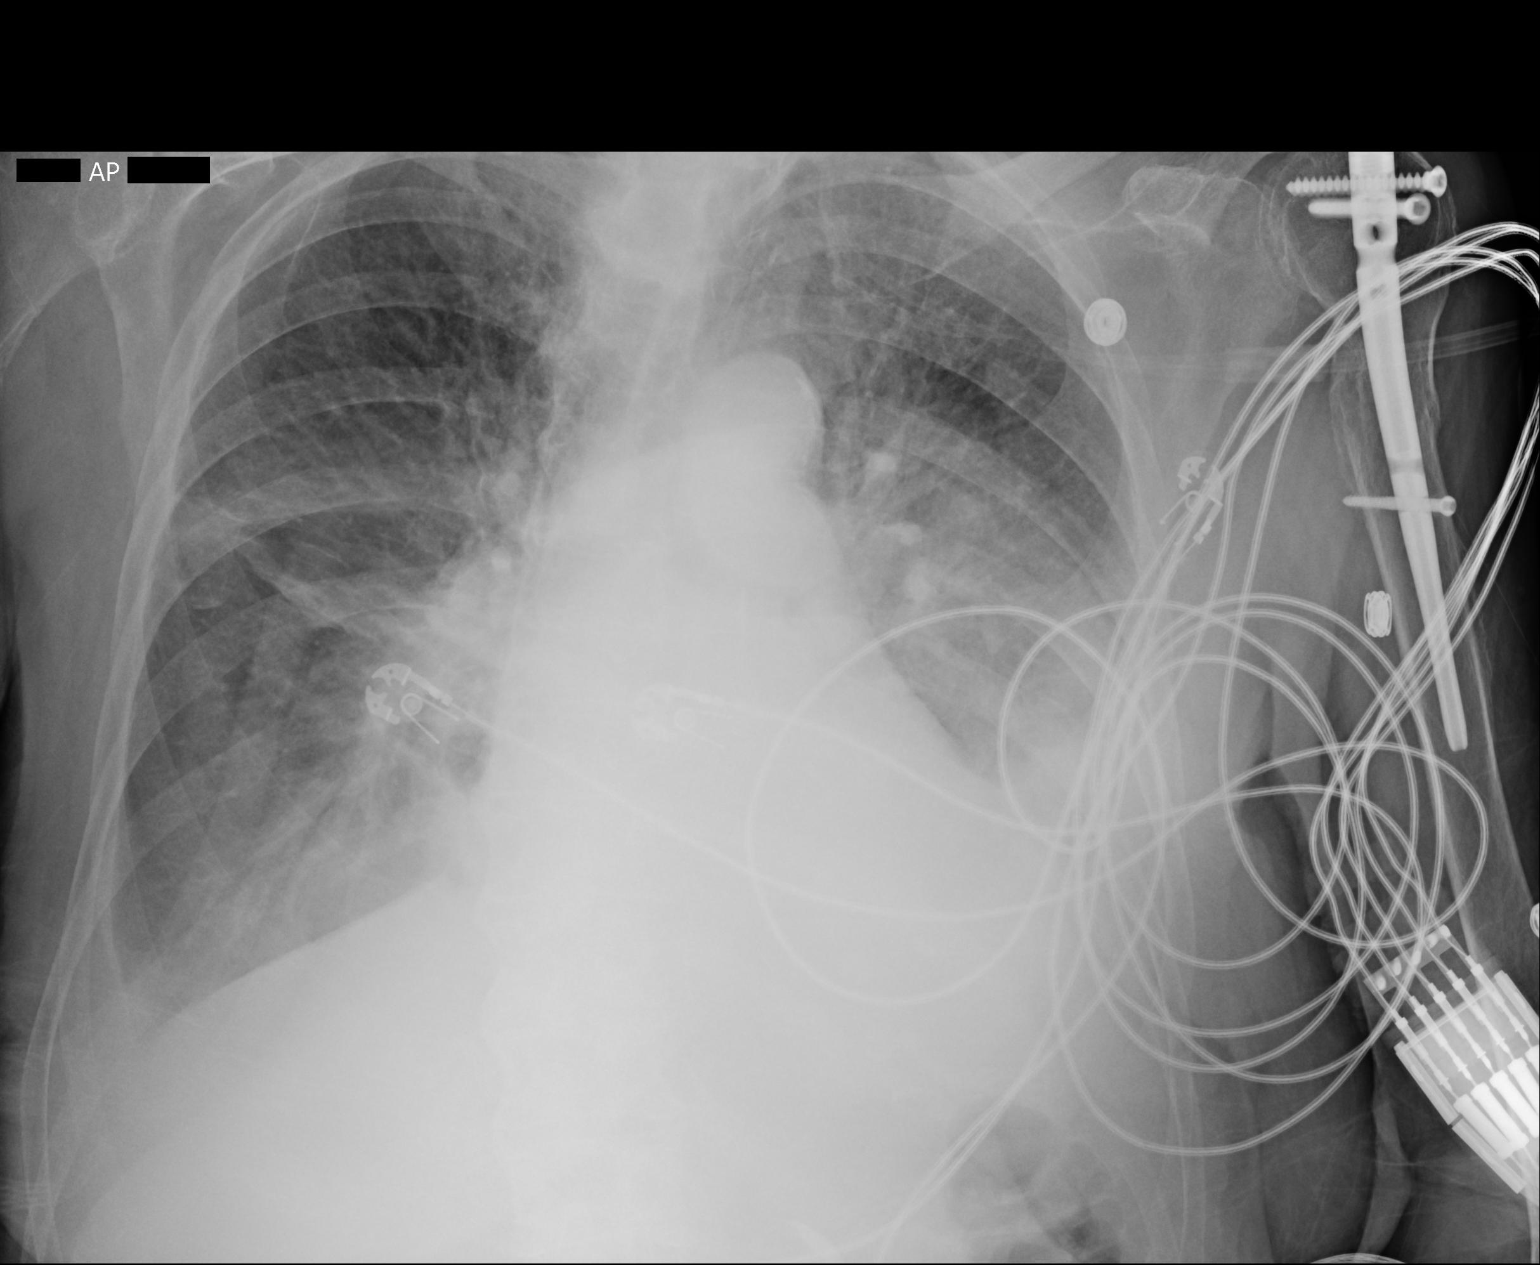

[1 of 1 positions shown; findings below may reference images not displayed]

FINDINGS: Less prominent pleural effusion and lower lobe atelectasis on the
left with residual atelectasis/ consolidation remaining as well as
some remaining left pleural fluid. The right pleural effusion is
relatively stable. Perihilar atelectasis on the right has improved.
Interstitial edema is relatively stable.
IMPRESSION: Less prominent left pleural effusion and left lower lobe
atelectasis. Decrease in right perihilar atelectasis.
